# Patient Record
Sex: Female | Born: 1985
Health system: Southern US, Community
[De-identification: ages and names within clinical notes are randomized; demographics above are authoritative.]

## PROBLEM LIST (undated history)

## (undated) DIAGNOSIS — N7011 Chronic salpingitis: Secondary | ICD-10-CM

---

## 2007-03-20 ENCOUNTER — Emergency Department (HOSPITAL_COMMUNITY): Admission: EM | Admit: 2007-03-20 | Discharge: 2007-03-20 | Payer: Self-pay | Admitting: Family Medicine

## 2012-08-02 ENCOUNTER — Other Ambulatory Visit: Payer: Self-pay | Admitting: Obstetrics and Gynecology

## 2012-09-08 HISTORY — PX: MULTIPLE TOOTH EXTRACTIONS: SHX2053

## 2012-09-08 HISTORY — PX: WISDOM TOOTH EXTRACTION: SHX21

## 2012-11-22 ENCOUNTER — Encounter: Payer: Self-pay | Admitting: Obstetrics and Gynecology

## 2012-11-22 ENCOUNTER — Ambulatory Visit: Payer: BC Managed Care – PPO | Admitting: Obstetrics and Gynecology

## 2012-11-22 VITALS — BP 102/60 | Wt 117.0 lb

## 2012-11-22 DIAGNOSIS — N760 Acute vaginitis: Secondary | ICD-10-CM

## 2012-11-22 DIAGNOSIS — N898 Other specified noninflammatory disorders of vagina: Secondary | ICD-10-CM

## 2012-11-22 LAB — POCT WET PREP (WET MOUNT)

## 2012-11-22 MED ORDER — TINIDAZOLE 500 MG PO TABS: 2.0000 g | ORAL_TABLET | Freq: Every day | ORAL | Status: DC

## 2012-11-22 NOTE — Progress Notes (Signed)
Vaginal Discharge: Color: White Odor: yes Itching:no Thin:no Thick:no Fever:no Dyspareunia:no Hx PID:no HX STD:no Pelvic Pain:no Desires Gc/CT:no Desires HIV,RPR,HbsAG:no  C/o vaginal odor and d/c  Filed Vitals:   11/22/12 1425  BP: 102/60   ROS: noncontributory  Pelvic exam:  VULVA: normal appearing vulva with no masses, tenderness or lesions,  VAGINA: normal appearing vagina with normal color and discharge, no lesions, CERVIX: normal appearing cervix without discharge or lesions,  UTERUS: uterus is normal size, shape, consistency and nontender,  ADNEXA: normal adnexa in size, nontender and no masses.  A/P Wet prep - BV - Tindamax AEX in Jan 2015

## 2016-09-03 DIAGNOSIS — N760 Acute vaginitis: Secondary | ICD-10-CM | POA: Diagnosis not present

## 2016-11-11 DIAGNOSIS — N939 Abnormal uterine and vaginal bleeding, unspecified: Secondary | ICD-10-CM | POA: Diagnosis not present

## 2017-02-11 DIAGNOSIS — N970 Female infertility associated with anovulation: Secondary | ICD-10-CM | POA: Diagnosis not present

## 2017-02-19 DIAGNOSIS — N7001 Acute salpingitis: Secondary | ICD-10-CM | POA: Diagnosis not present

## 2017-03-10 ENCOUNTER — Other Ambulatory Visit (HOSPITAL_COMMUNITY): Payer: Self-pay | Admitting: Obstetrics and Gynecology

## 2017-03-10 DIAGNOSIS — Z3141 Encounter for fertility testing: Secondary | ICD-10-CM

## 2017-03-16 ENCOUNTER — Ambulatory Visit (HOSPITAL_COMMUNITY)
Admission: RE | Admit: 2017-03-16 | Discharge: 2017-03-16 | Disposition: A | Discharge status: Home / Self Care | Payer: BLUE CROSS/BLUE SHIELD | Source: Ambulatory Visit | Attending: Obstetrics and Gynecology | Admitting: Obstetrics and Gynecology

## 2017-03-16 DIAGNOSIS — Z3141 Encounter for fertility testing: Secondary | ICD-10-CM | POA: Insufficient documentation

## 2017-03-16 MED ORDER — IOPAMIDOL (ISOVUE-300) INJECTION 61%
30.0000 mL | Freq: Once | INTRAVENOUS | Status: AC | PRN
  Administered 2017-03-16: 10 mL via INTRAVENOUS

## 2017-05-06 DIAGNOSIS — N971 Female infertility of tubal origin: Secondary | ICD-10-CM | POA: Diagnosis not present

## 2017-05-06 DIAGNOSIS — Z319 Encounter for procreative management, unspecified: Secondary | ICD-10-CM | POA: Diagnosis not present

## 2017-05-06 DIAGNOSIS — Z31430 Encounter of female for testing for genetic disease carrier status for procreative management: Secondary | ICD-10-CM | POA: Diagnosis not present

## 2017-06-18 DIAGNOSIS — N979 Female infertility, unspecified: Secondary | ICD-10-CM | POA: Diagnosis not present

## 2017-06-18 DIAGNOSIS — N912 Amenorrhea, unspecified: Secondary | ICD-10-CM | POA: Diagnosis not present

## 2017-07-03 ENCOUNTER — Other Ambulatory Visit: Payer: Self-pay | Admitting: Obstetrics and Gynecology

## 2017-07-06 DIAGNOSIS — Z6823 Body mass index (BMI) 23.0-23.9, adult: Secondary | ICD-10-CM | POA: Diagnosis not present

## 2017-07-06 DIAGNOSIS — N970 Female infertility associated with anovulation: Secondary | ICD-10-CM | POA: Diagnosis not present

## 2017-07-15 ENCOUNTER — Encounter (HOSPITAL_BASED_OUTPATIENT_CLINIC_OR_DEPARTMENT_OTHER): Payer: Self-pay | Admitting: *Deleted

## 2017-07-15 ENCOUNTER — Other Ambulatory Visit: Payer: Self-pay

## 2017-07-15 DIAGNOSIS — N7011 Chronic salpingitis: Secondary | ICD-10-CM | POA: Diagnosis not present

## 2017-07-15 DIAGNOSIS — N971 Female infertility of tubal origin: Secondary | ICD-10-CM | POA: Diagnosis not present

## 2017-07-15 NOTE — Progress Notes (Signed)
Npo after midnight arrive 730 am 07-11-17 [facility] needs hemaglobin and urine pregnancy, husband driver. No meds to take. Needs pre op orders

## 2017-07-20 NOTE — H&P (Addendum)
Lynn Richards is a 31 y.o. female , originally referred to me by Dr. Belva AgeeElise Leger, for laparoscopy, lysis of adhesions, repair vs removal of hydrosalpinx of right, hysteroscopy, canulation of left tube.  She was diagnosed with hydrosalpinx of the right tube and adhesions to both tubes. Patient would like to preserve her childbearing potential.  Pertinent Gynecological History: Menses: flow is more than normal Bleeding: dysfunctional uterine bleeding Contraception: none DES exposure: denies Blood transfusions: none Sexually transmitted diseases: no past history   Last pap: normal  OB History: G0P0   Menstrual History: Menarche age: [age] No LMP recorded.    Past Medical History:  Diagnosis Date  . Hydrosalpinx    TUBAL ADHESIONS                    Past Surgical History:  Procedure Laterality Date  . MULTIPLE TOOTH EXTRACTIONS  2014  . WISDOM TOOTH EXTRACTION  2014             History reviewed. No pertinent family history. No hereditary disease.  No cancer of breast, ovary, uterus. No cutaneous leiomyomatosis or renal cell carcinoma.  Social History   Socioeconomic History  . Marital status: Married    Spouse name: Not on file  . Number of children: Not on file  . Years of education: Not on file  . Highest education level: Not on file  Social Needs  . Financial resource strain: Not on file  . Food insecurity - worry: Not on file  . Food insecurity - inability: Not on file  . Transportation needs - medical: Not on file  . Transportation needs - non-medical: Not on file  Occupational History  . Not on file  Tobacco Use  . Smoking status: Never Smoker  . Smokeless tobacco: Never Used  Substance and Sexual Activity  . Alcohol use: No    Frequency: Never  . Drug use: No  . Sexual activity: Yes    Partners: Male  Other Topics Concern  . Not on file  Social History Narrative  . Not on file    No Known Allergies  No current facility-administered medications on  file prior to encounter.    Current Outpatient Medications on File Prior to Encounter  Medication Sig Dispense Refill  . Echinacea 400 MG CAPS Take daily by mouth.    . Prenatal Vit-Fe Fumarate-FA (PRENATAL VITAMIN PO) Take daily by mouth.    . drospirenone-ethinyl estradiol (OCELLA) 3-0.03 MG tablet Take 1 tablet by mouth daily.    Marland Kitchen. tinidazole (TINDAMAX) 500 MG tablet Take 4 tablets (2,000 mg total) by mouth daily. For 2 days. 8 tablet 0     Review of Systems  Constitutional: Negative.   HENT: Negative.   Eyes: Negative.   Respiratory: Negative.   Cardiovascular: Negative.   Gastrointestinal: Negative.   Genitourinary: Negative.   Musculoskeletal: Negative.   Skin: Negative.   Neurological: Negative.   Endo/Heme/Allergies: Negative.   Psychiatric/Behavioral: Negative.      Physical Exam  Ht 5\' 5"  (1.651 m)   LMP 06/21/2017   BMI 19.47 kg/m  Constitutional: She is oriented to person, place, and time. She appears well-developed and well-nourished.  HENT:  Head: Normocephalic and atraumatic.  Nose: Nose normal.  Mouth/Throat: Oropharynx is clear and moist. No oropharyngeal exudate.  Eyes: Conjunctivae normal and EOM are normal. Pupils are equal, round, and reactive to light. No scleral icterus.  Neck: Normal range of motion. Neck supple. No tracheal deviation present. No thyromegaly present.  Cardiovascular: Normal rate.   Respiratory: Effort normal and breath sounds normal.  GI: Soft. Bowel sounds are normal. She exhibits no distension and no mass. There is no tenderness.  Lymphadenopathy:    She has no cervical adenopathy.  Neurological: She is alert and oriented to person, place, and time. She has normal reflexes.  Skin: Skin is warm.  Psychiatric: She has a normal mood and affect. Her behavior is normal. Judgment and thought content normal.     Assessment/Plan:  Hydrosalpinx and paratubal adhesions. Laparoscopy, lysis of adhesions, repair vs removal of  hydrosalpinix of right, hysteroscopy, canulation of left tube Benefits and risks of laparoscopy, lysis of adhesions, repair vs removal of hydrosalpinx of right, hysteroscopy, cannulation of left tube were discussed with the patient and her family members again.  Bowel prep instructions were given.  All of patient's questions were answered.  She verbalized understanding.

## 2017-07-21 ENCOUNTER — Encounter (HOSPITAL_BASED_OUTPATIENT_CLINIC_OR_DEPARTMENT_OTHER): Payer: Self-pay

## 2017-07-21 ENCOUNTER — Ambulatory Visit (HOSPITAL_BASED_OUTPATIENT_CLINIC_OR_DEPARTMENT_OTHER): Payer: BLUE CROSS/BLUE SHIELD | Admitting: Anesthesiology

## 2017-07-21 ENCOUNTER — Encounter (HOSPITAL_BASED_OUTPATIENT_CLINIC_OR_DEPARTMENT_OTHER)
Admission: RE | Disposition: A | Discharge status: Home / Self Care | Payer: Self-pay | Source: Ambulatory Visit | Attending: Obstetrics and Gynecology

## 2017-07-21 ENCOUNTER — Ambulatory Visit (HOSPITAL_BASED_OUTPATIENT_CLINIC_OR_DEPARTMENT_OTHER)
Admission: RE | Admit: 2017-07-21 | Discharge: 2017-07-21 | Disposition: A | Discharge status: Home / Self Care | Payer: BLUE CROSS/BLUE SHIELD | Source: Ambulatory Visit | Attending: Obstetrics and Gynecology | Admitting: Obstetrics and Gynecology

## 2017-07-21 DIAGNOSIS — N736 Female pelvic peritoneal adhesions (postinfective): Secondary | ICD-10-CM | POA: Diagnosis not present

## 2017-07-21 DIAGNOSIS — N971 Female infertility of tubal origin: Secondary | ICD-10-CM | POA: Insufficient documentation

## 2017-07-21 DIAGNOSIS — Q5122 Other partial doubling of uterus: Secondary | ICD-10-CM | POA: Diagnosis not present

## 2017-07-21 DIAGNOSIS — N7011 Chronic salpingitis: Secondary | ICD-10-CM | POA: Insufficient documentation

## 2017-07-21 DIAGNOSIS — Q512 Other doubling of uterus, unspecified: Secondary | ICD-10-CM | POA: Diagnosis not present

## 2017-07-21 HISTORY — PX: LAPAROSCOPIC UNILATERAL SALPINGECTOMY: SHX5934

## 2017-07-21 HISTORY — DX: Chronic salpingitis: N70.11

## 2017-07-21 LAB — POCT PREGNANCY, URINE: PREG TEST UR: NEGATIVE

## 2017-07-21 LAB — POCT HEMOGLOBIN-HEMACUE: Hemoglobin: 12 g/dL (ref 12.0–15.0)

## 2017-07-21 SURGERY — SALPINGECTOMY, UNILATERAL, LAPAROSCOPIC
Anesthesia: General | Site: Abdomen | Laterality: Right

## 2017-07-21 MED ORDER — ACETAMINOPHEN 10 MG/ML IV SOLN
INTRAVENOUS | Status: AC
  Filled 2017-07-21: qty 100

## 2017-07-21 MED ORDER — METHYLENE BLUE 0.5 % INJ SOLN
INTRAVENOUS | Status: DC | PRN
  Administered 2017-07-21 (×2): 2 mL via SUBMUCOSAL

## 2017-07-21 MED ORDER — CEFAZOLIN SODIUM-DEXTROSE 2-4 GM/100ML-% IV SOLN
2.0000 g | INTRAVENOUS | Status: AC
  Administered 2017-07-21: 2 g via INTRAVENOUS
  Filled 2017-07-21: qty 100

## 2017-07-21 MED ORDER — FENTANYL CITRATE (PF) 250 MCG/5ML IJ SOLN
INTRAMUSCULAR | Status: AC
  Filled 2017-07-21: qty 5

## 2017-07-21 MED ORDER — ROCURONIUM BROMIDE 10 MG/ML (PF) SYRINGE
PREFILLED_SYRINGE | INTRAVENOUS | Status: DC | PRN
  Administered 2017-07-21: 50 mg via INTRAVENOUS
  Administered 2017-07-21 (×3): 10 mg via INTRAVENOUS

## 2017-07-21 MED ORDER — OXYCODONE-ACETAMINOPHEN 7.5-325 MG PO TABS: 1.0000 | ORAL_TABLET | ORAL | 0 refills | Status: DC | PRN

## 2017-07-21 MED ORDER — DEXAMETHASONE SODIUM PHOSPHATE 10 MG/ML IJ SOLN
INTRAMUSCULAR | Status: DC | PRN
  Administered 2017-07-21: 10 mg via INTRAVENOUS

## 2017-07-21 MED ORDER — MIDAZOLAM HCL 2 MG/2ML IJ SOLN
INTRAMUSCULAR | Status: AC
  Filled 2017-07-21: qty 2

## 2017-07-21 MED ORDER — ONDANSETRON HCL 4 MG PO TABS: 4.0000 mg | ORAL_TABLET | Freq: Three times a day (TID) | ORAL | 0 refills | Status: DC | PRN

## 2017-07-21 MED ORDER — BUPIVACAINE-EPINEPHRINE 0.25% -1:200000 IJ SOLN
INTRAMUSCULAR | Status: DC | PRN
  Administered 2017-07-21: 8 mL

## 2017-07-21 MED ORDER — ONDANSETRON HCL 4 MG/2ML IJ SOLN
INTRAMUSCULAR | Status: AC
  Filled 2017-07-21: qty 2

## 2017-07-21 MED ORDER — LACTATED RINGERS IV SOLN
INTRAVENOUS | Status: DC
  Administered 2017-07-21 (×4): via INTRAVENOUS
  Filled 2017-07-21: qty 1000

## 2017-07-21 MED ORDER — METHYLENE BLUE 0.5 % INJ SOLN
INTRAVENOUS | Status: AC
  Filled 2017-07-21: qty 10

## 2017-07-21 MED ORDER — CEFAZOLIN SODIUM-DEXTROSE 2-4 GM/100ML-% IV SOLN
INTRAVENOUS | Status: AC
  Filled 2017-07-21: qty 100

## 2017-07-21 MED ORDER — ONDANSETRON HCL 4 MG/2ML IJ SOLN
INTRAMUSCULAR | Status: DC | PRN
  Administered 2017-07-21: 4 mg via INTRAVENOUS

## 2017-07-21 MED ORDER — HYDROMORPHONE HCL 1 MG/ML IJ SOLN
INTRAMUSCULAR | Status: AC
  Filled 2017-07-21: qty 1

## 2017-07-21 MED ORDER — PROPOFOL 10 MG/ML IV BOLUS
INTRAVENOUS | Status: AC
  Filled 2017-07-21: qty 40

## 2017-07-21 MED ORDER — LIDOCAINE 2% (20 MG/ML) 5 ML SYRINGE
INTRAMUSCULAR | Status: AC
  Filled 2017-07-21: qty 5

## 2017-07-21 MED ORDER — PROPOFOL 10 MG/ML IV BOLUS
INTRAVENOUS | Status: DC | PRN
  Administered 2017-07-21: 130 mg via INTRAVENOUS

## 2017-07-21 MED ORDER — ONDANSETRON HCL 4 MG/2ML IJ SOLN
4.0000 mg | Freq: Once | INTRAMUSCULAR | Status: AC | PRN
  Administered 2017-07-21: 4 mg via INTRAVENOUS
  Filled 2017-07-21: qty 2

## 2017-07-21 MED ORDER — OXYCODONE HCL 5 MG PO TABS
5.0000 mg | ORAL_TABLET | Freq: Once | ORAL | Status: AC
Start: 2017-07-21 — End: 2017-07-21
  Administered 2017-07-21: 5 mg via ORAL
  Filled 2017-07-21: qty 1

## 2017-07-21 MED ORDER — FENTANYL CITRATE (PF) 100 MCG/2ML IJ SOLN
INTRAMUSCULAR | Status: DC | PRN
  Administered 2017-07-21 (×2): 50 ug via INTRAVENOUS
  Administered 2017-07-21: 150 ug via INTRAVENOUS

## 2017-07-21 MED ORDER — ROCURONIUM BROMIDE 50 MG/5ML IV SOSY
PREFILLED_SYRINGE | INTRAVENOUS | Status: AC
  Filled 2017-07-21: qty 5

## 2017-07-21 MED ORDER — SUGAMMADEX SODIUM 200 MG/2ML IV SOLN
INTRAVENOUS | Status: AC
  Filled 2017-07-21: qty 2

## 2017-07-21 MED ORDER — MEPERIDINE HCL 25 MG/ML IJ SOLN
6.2500 mg | INTRAMUSCULAR | Status: DC | PRN
  Filled 2017-07-21: qty 1

## 2017-07-21 MED ORDER — DEXAMETHASONE SODIUM PHOSPHATE 10 MG/ML IJ SOLN
INTRAMUSCULAR | Status: AC
  Filled 2017-07-21: qty 1

## 2017-07-21 MED ORDER — MIDAZOLAM HCL 2 MG/2ML IJ SOLN
INTRAMUSCULAR | Status: DC | PRN
  Administered 2017-07-21: 2 mg via INTRAVENOUS

## 2017-07-21 MED ORDER — SUGAMMADEX SODIUM 200 MG/2ML IV SOLN
INTRAVENOUS | Status: DC | PRN
  Administered 2017-07-21: 300 mg via INTRAVENOUS

## 2017-07-21 MED ORDER — LIDOCAINE 2% (20 MG/ML) 5 ML SYRINGE
INTRAMUSCULAR | Status: DC | PRN
  Administered 2017-07-21: 100 mg via INTRAVENOUS

## 2017-07-21 MED ORDER — SUGAMMADEX SODIUM 200 MG/2ML IV SOLN
INTRAVENOUS | Status: AC
  Filled 2017-07-21: qty 4

## 2017-07-21 MED ORDER — HYDROMORPHONE HCL 1 MG/ML IJ SOLN
0.2500 mg | INTRAMUSCULAR | Status: DC | PRN
  Administered 2017-07-21 (×2): 0.5 mg via INTRAVENOUS
  Filled 2017-07-21: qty 0.5

## 2017-07-21 MED ORDER — OXYCODONE HCL 5 MG PO TABS
ORAL_TABLET | ORAL | Status: AC
  Filled 2017-07-21: qty 1

## 2017-07-21 SURGICAL SUPPLY — 42 items
BAG URINE DRAINAGE (UROLOGICAL SUPPLIES) ×2 IMPLANT
BARRIER ADHS 3X4 INTERCEED (GAUZE/BANDAGES/DRESSINGS) ×2 IMPLANT
CABLE HIGH FREQUENCY MONO STRZ (ELECTRODE) ×2 IMPLANT
CATH FOLEY 2WAY SLVR  5CC 14FR (CATHETERS) ×1
CATH FOLEY 2WAY SLVR 5CC 14FR (CATHETERS) ×1 IMPLANT
CATH SSG INJECTION W/GUIDEWIRE (BALLOONS) ×2 IMPLANT
CATH UROLOGY TORQUE 40 (MISCELLANEOUS) ×2 IMPLANT
CLOTH BEACON ORANGE TIMEOUT ST (SAFETY) ×2 IMPLANT
CONT SPECI 4OZ STER CLIK (MISCELLANEOUS) IMPLANT
COVER MAYO STAND STRL (DRAPES) ×2 IMPLANT
DERMABOND ADVANCED (GAUZE/BANDAGES/DRESSINGS) ×1
DERMABOND ADVANCED .7 DNX12 (GAUZE/BANDAGES/DRESSINGS) ×1 IMPLANT
DRSG COVADERM PLUS 2X2 (GAUZE/BANDAGES/DRESSINGS) IMPLANT
DRSG OPSITE POSTOP 3X4 (GAUZE/BANDAGES/DRESSINGS) IMPLANT
DURAPREP 26ML APPLICATOR (WOUND CARE) ×2 IMPLANT
ELECT REM PT RETURN 9FT ADLT (ELECTROSURGICAL) ×2
ELECTRODE REM PT RTRN 9FT ADLT (ELECTROSURGICAL) ×1 IMPLANT
GLOVE BIO SURGEON STRL SZ8 (GLOVE) ×4 IMPLANT
GOWN STRL REUS W/TWL LRG LVL3 (GOWN DISPOSABLE) ×2 IMPLANT
MANIPULATOR UTERINE 4.5 ZUMI (MISCELLANEOUS) ×2 IMPLANT
NEEDLE INSUFFLATION 120MM (ENDOMECHANICALS) ×2 IMPLANT
NEEDLE SPNL 22GX7 QUINCKE BK (NEEDLE) ×2 IMPLANT
PACK LAPAROSCOPY BASIN (CUSTOM PROCEDURE TRAY) ×2 IMPLANT
PACK TRENDGUARD 450 HYBRID PRO (MISCELLANEOUS) ×1 IMPLANT
PACK VAGINAL MINOR WOMEN LF (CUSTOM PROCEDURE TRAY) ×2 IMPLANT
POUCH SPECIMEN RETRIEVAL 10MM (ENDOMECHANICALS) IMPLANT
SEPRAFILM MEMBRANE 5X6 (MISCELLANEOUS) IMPLANT
SET IRRIG TUBING LAPAROSCOPIC (IRRIGATION / IRRIGATOR) ×2 IMPLANT
SET IRRIG Y TYPE TUR BLADDER L (SET/KITS/TRAYS/PACK) ×2 IMPLANT
SUT MNCRL AB 4-0 PS2 18 (SUTURE) ×2 IMPLANT
SUT PROLENE 5 0 P 3 (SUTURE) ×4 IMPLANT
SYR 30ML LL (SYRINGE) ×2 IMPLANT
SYR 50ML LL SCALE MARK (SYRINGE) IMPLANT
SYR 5ML LL (SYRINGE) ×2 IMPLANT
SYR CONTROL 10ML LL (SYRINGE) ×2 IMPLANT
TOWEL OR 17X24 6PK STRL BLUE (TOWEL DISPOSABLE) ×4 IMPLANT
TRAY FOLEY CATH SILVER 14FR (SET/KITS/TRAYS/PACK) IMPLANT
TRENDGUARD 450 HYBRID PRO PACK (MISCELLANEOUS) ×2
TROCAR OPTI TIP 5M 100M (ENDOMECHANICALS) ×4 IMPLANT
TUBE CONNECTING 12X1/4 (SUCTIONS) ×2 IMPLANT
TUBING INSUF HEATED (TUBING) ×2 IMPLANT
WARMER LAPAROSCOPE (MISCELLANEOUS) ×2 IMPLANT

## 2017-07-21 NOTE — Anesthesia Procedure Notes (Signed)
Procedure Name: Intubation Date/Time: 07/21/2017 10:19 AM Performed by: Wanita Chamberlain, CRNA Pre-anesthesia Checklist: Timeout performed, Patient identified, Emergency Drugs available, Suction available and Patient being monitored Patient Re-evaluated:Patient Re-evaluated prior to induction Oxygen Delivery Method: Circle system utilized Preoxygenation: Pre-oxygenation with 100% oxygen Induction Type: IV induction Ventilation: Mask ventilation without difficulty Laryngoscope Size: Mac and 3 Grade View: Grade I Tube type: Oral Tube size: 7.0 mm Number of attempts: 1 Placement Confirmation: ETT inserted through vocal cords under direct vision,  positive ETCO2 and breath sounds checked- equal and bilateral Secured at: 21 cm Dental Injury: Teeth and Oropharynx as per pre-operative assessment

## 2017-07-21 NOTE — Anesthesia Postprocedure Evaluation (Signed)
Anesthesia Post Note  Patient: Radiation protection practitionerAsia Richards  Procedure(s) Performed:         Hysteroscopic incision of uterine septum, Left tubal catheterization, endometrial biopsy, laparoscopic lysis of adhesions and left salpingoeostomy   (Right Abdomen)     Patient location during evaluation: PACU Anesthesia Type: General Level of consciousness: awake and alert, patient cooperative and oriented Pain management: pain level controlled Vital Signs Assessment: post-procedure vital signs reviewed and stable Respiratory status: spontaneous breathing, nonlabored ventilation and respiratory function stable Cardiovascular status: blood pressure returned to baseline and stable Postop Assessment: no apparent nausea or vomiting Anesthetic complications: no    Last Vitals:  Vitals:   07/21/17 1445 07/21/17 1500  BP: 119/79 127/81  Pulse: 74 85  Resp: 15 14  Temp:    SpO2: 95% 98%    Last Pain:  Vitals:   07/21/17 1530  TempSrc:   PainSc: 4                  Lem Peary,E. Cambell Stanek

## 2017-07-21 NOTE — Op Note (Signed)
Operative Note  Preoperative diagnosis: Right hydrosalpinx, left proximal tubal occlusion, suspected pelvic adhesions  Postoperative diagnosis: Bilateral hydrosalpinx, left proximal tubal occlusion (left bipolar tubal disease, partial uterine septum   Procedure: Laparoscopy, lysis of adhesions,, bilateral salpingo-neostomy, hysteroscopy, incisional uterine septum, endometrial biopsy, hysteroscopic left tubal recanalization  Anesthesia: Gen. endotracheal  Complications: None  Estimated blood loss: <10 cc  Specimens:  Left ovarian adhesion to pathology  Findings: On laparoscopy, upper abdomen, liver surface and diaphragm surfaces were normal except for a single adhesion between the left liver surface and the diaphragm. Gallbladder was  normal. The appendix was  grossly normal The pelvic peritoneum had inflammatory loose adhesions throughout.   her left tube was 20% encased in filmy adhesions to the left ovary. Upon dissection of these adhesions were able to see a fimbrial tuft. The left tube was initially proximally occluded. After hysteroscopic tubal recanalization tube filled with a hydrosalpinx.  During the tuboplasty the left tubal mucosal folds were 50% preserved. The hydrosalpinx was thin-walled.  The left ovary was 20% encased in filmy adhesions to the left tube and to the pelvic sidewall. These were lysed.   Similar to the left side, the right tube was 20% encased in filmy adhesions to the right ovary. It showed hydrosalpinx of about 1.5 cm diameter. The hydrosalpinx was thin-walled and upon tuboplasty, the mucosal folds in the tubes were 75% preserved.   Right ovary was encased in filmy adhesions this 20% over its surface area to the pelvic sidewall and to the right tube. These adhesions were lysed.   On hysteroscopy, endocervical canal appeared normal and the uterus sounded to 7 cm and had menstrual pattern endometrium. There was a midline fundal indentation of about 1 cm into the  cavity, giving the uterine cavity. A partially septate appearance. This septum was corrected during the hysteroscopy. Both tubal ostia were seen and appeared unobstructed.  Description of the procedure: The patient was placed in dorsal supine position and general endotracheal anesthesia was given. 2 g of cefazolin were given intravenously for prophylaxis. Patient was placed in lithotomy position. She was prepped and draped inside manner.  . A Foley catheter was inserted into the bladder.  After preemptive anesthesia of all surgical sites with 0.25% bupivacaine with 1 200,000 epinephrine, a 5 mm intraumbilical skin incision was made and a Verress needle was inserted. Its correct location was confirmed. A pneumoperitoneum was created with carbon dioxide.  5 mm laparoscope with a 30 lens was inserted and video laparoscopy was started . A left lower quadrant 5 mm and a right lower quadrant  5 mm incisions were made and ancillary trochars were placed under direct visualization. Above findings were noted.   Using a needle electrode on 35 W of cutting current, we lysed the adhesions surrounding the left ovary and the left tube and separated the clubbed distal end of the left tube from its adhesion to the surface of the left ovary.  We used the same mode of energy to repeat the same procedures on the right tube and ovary and performed a thorough salpingo-oophorolysis.  The surgeon then proceeded for hysteroscopy and dilute vasopressin was injected into the cervix. Video hysteroscopy was started with a Slimline 12 hysteroscope. Distention medium was saline and distention method was gravity. Above findings were noted.  Using hysteroscopic scissors. We first incised the midline septum until straight uterine fundus was obtained. Good hemostasis was ensured. We then passed a 5 French torque catheter and wedged it into the left  tubal ostium, since previous chromotubation through the ZUMI catheter had shown  nonfilling of the left tube with formation of a right hydrosalpinx. The cornual axis catheter with a 0.015 inch glide wire was then passed through the torque catheter and the tubal catheter could be advanced into the proximal tube by about 1 cm. The guidewire was pulled out and selective chromotubation was performed for the left tube showing filling of the left tube and formation of a left hydrosalpinx due to distal obstruction.  The patient's preoperative wish was to preserve fallopian tubes if at all possible, even though bilateral salpingectomy and subsequent IVF was recommended as a better choice. Considering that her hydrosalpinges were thin-walled, decision was made to proceed with bilateral salpingoneostomies We first injected dilute vasopressin into the mesosalpinx of the left hydrosalpinx. A stellate incision was made right at the fimbrial tuft using needle electrode with cutting current at 20 W and then sharp scissors were used to enter the tubal lumen. The flaps were everted and sewn onto the ampullary serosa at corresponding spots using 5-0 Prolene and intracorporeal knot tying.  The same steps were followed for tuboplasty on the right hydrosalpinx. Again, the distal tubal flaps were everted and sewn onto the ampullary serosa using 5-0 Prolene and intracorporeal knot tying.    Adequate hemostasis was obtained. The pelvis was copiously irrigated and aspirated. As an adhesion barrier, we divided a sheet of Interceed into 2 and delivered it into the abdomen and wrapped distal end of each tuboplasty in the Interceed sheets.  Instrument and lap pad count were correct. The trochars were removed and the gas was allowed to escape. The incisions were approximated with Dermabond.  The patient tolerated the procedure well and was transferred to recovery room in satisfactory condition.  Fermin SchwabYALCINKAYA,Emersyn Wyss, MD

## 2017-07-21 NOTE — Transfer of Care (Signed)
Immediate Anesthesia Transfer of Care Note  Patient: Lynn Richards  Procedure(s) Performed:         Hysteroscopic incision of uterine septum, Left tubal catheterization, endometrial biopsy, laparoscopic lysis of adhesions and left salpingoeostomy   (Right Abdomen)  Patient Location: PACU  Anesthesia Type:General  Level of Consciousness: awake, alert , oriented and patient cooperative  Airway & Oxygen Therapy: Patient Spontanous Breathing and Patient connected to nasal cannula oxygen  Post-op Assessment: Report given to RN and Post -op Vital signs reviewed and stable  Post vital signs: Reviewed and stable  Last Vitals:  Vitals:   07/21/17 0732  BP: 123/64  Pulse: 76  Resp: 14  Temp: 37.1 C  SpO2: 100%    Last Pain:  Vitals:   07/21/17 0732  TempSrc: Oral      Patients Stated Pain Goal: 5 (07/21/17 0748)  Complications: No apparent anesthesia complications

## 2017-07-21 NOTE — Discharge Instructions (Signed)

## 2017-07-21 NOTE — Anesthesia Preprocedure Evaluation (Addendum)
Anesthesia Evaluation  Patient identified by MRN, date of birth, ID band Patient awake    Reviewed: Allergy & Precautions, NPO status , Patient's Chart, lab work & pertinent test results  Airway Mallampati: I  TM Distance: >3 FB Neck ROM: Full    Dental  (+) Teeth Intact, Dental Advisory Given   Pulmonary    Pulmonary exam normal        Cardiovascular Exercise Tolerance: Good Normal cardiovascular exam Rhythm:Regular Rate:Normal     Neuro/Psych negative neurological ROS  negative psych ROS   GI/Hepatic Neg liver ROS,   Endo/Other  negative endocrine ROS  Renal/GU negative Renal ROS     Musculoskeletal negative musculoskeletal ROS (+)   Abdominal   Peds  Hematology negative hematology ROS (+)   Anesthesia Other Findings   Reproductive/Obstetrics                           Anesthesia Physical Anesthesia Plan  ASA: II  Anesthesia Plan: General   Post-op Pain Management:    Induction: Intravenous  PONV Risk Score and Plan: 3 and Ondansetron, Dexamethasone and Treatment may vary due to age or medical condition  Airway Management Planned: Oral ETT  Additional Equipment:   Intra-op Plan:   Post-operative Plan: Extubation in OR  Informed Consent: I have reviewed the patients History and Physical, chart, labs and discussed the procedure including the risks, benefits and alternatives for the proposed anesthesia with the patient or authorized representative who has indicated his/her understanding and acceptance.     Plan Discussed with: CRNA and Surgeon  Anesthesia Plan Comments:         Anesthesia Quick Evaluation

## 2017-07-22 ENCOUNTER — Encounter (HOSPITAL_BASED_OUTPATIENT_CLINIC_OR_DEPARTMENT_OTHER): Payer: Self-pay | Admitting: Obstetrics and Gynecology

## 2017-10-14 DIAGNOSIS — N8500 Endometrial hyperplasia, unspecified: Secondary | ICD-10-CM | POA: Diagnosis not present

## 2017-10-14 DIAGNOSIS — Z3141 Encounter for fertility testing: Secondary | ICD-10-CM | POA: Diagnosis not present

## 2017-10-14 DIAGNOSIS — Z113 Encounter for screening for infections with a predominantly sexual mode of transmission: Secondary | ICD-10-CM | POA: Diagnosis not present

## 2017-11-02 DIAGNOSIS — N970 Female infertility associated with anovulation: Secondary | ICD-10-CM | POA: Diagnosis not present

## 2017-11-02 DIAGNOSIS — Z3183 Encounter for assisted reproductive fertility procedure cycle: Secondary | ICD-10-CM | POA: Diagnosis not present

## 2017-11-02 DIAGNOSIS — N7011 Chronic salpingitis: Secondary | ICD-10-CM | POA: Diagnosis not present

## 2017-11-02 DIAGNOSIS — N971 Female infertility of tubal origin: Secondary | ICD-10-CM | POA: Diagnosis not present

## 2017-11-02 DIAGNOSIS — Z113 Encounter for screening for infections with a predominantly sexual mode of transmission: Secondary | ICD-10-CM | POA: Diagnosis not present

## 2017-11-05 DIAGNOSIS — Z319 Encounter for procreative management, unspecified: Secondary | ICD-10-CM | POA: Diagnosis not present

## 2017-11-05 DIAGNOSIS — E288 Other ovarian dysfunction: Secondary | ICD-10-CM | POA: Diagnosis not present

## 2017-11-05 DIAGNOSIS — Z113 Encounter for screening for infections with a predominantly sexual mode of transmission: Secondary | ICD-10-CM | POA: Diagnosis not present

## 2017-11-05 DIAGNOSIS — Z3183 Encounter for assisted reproductive fertility procedure cycle: Secondary | ICD-10-CM | POA: Diagnosis not present

## 2017-11-05 DIAGNOSIS — Z31430 Encounter of female for testing for genetic disease carrier status for procreative management: Secondary | ICD-10-CM | POA: Diagnosis not present

## 2017-11-07 DIAGNOSIS — N970 Female infertility associated with anovulation: Secondary | ICD-10-CM | POA: Diagnosis not present

## 2017-11-07 DIAGNOSIS — Z3183 Encounter for assisted reproductive fertility procedure cycle: Secondary | ICD-10-CM | POA: Diagnosis not present

## 2017-11-07 DIAGNOSIS — N7011 Chronic salpingitis: Secondary | ICD-10-CM | POA: Diagnosis not present

## 2017-11-07 DIAGNOSIS — N971 Female infertility of tubal origin: Secondary | ICD-10-CM | POA: Diagnosis not present

## 2017-11-10 DIAGNOSIS — N971 Female infertility of tubal origin: Secondary | ICD-10-CM | POA: Diagnosis not present

## 2017-11-10 DIAGNOSIS — N7011 Chronic salpingitis: Secondary | ICD-10-CM | POA: Diagnosis not present

## 2017-11-10 DIAGNOSIS — Z3183 Encounter for assisted reproductive fertility procedure cycle: Secondary | ICD-10-CM | POA: Diagnosis not present

## 2017-11-10 DIAGNOSIS — N970 Female infertility associated with anovulation: Secondary | ICD-10-CM | POA: Diagnosis not present

## 2017-11-11 DIAGNOSIS — Z3183 Encounter for assisted reproductive fertility procedure cycle: Secondary | ICD-10-CM | POA: Diagnosis not present

## 2017-11-11 DIAGNOSIS — N971 Female infertility of tubal origin: Secondary | ICD-10-CM | POA: Diagnosis not present

## 2017-11-11 DIAGNOSIS — N970 Female infertility associated with anovulation: Secondary | ICD-10-CM | POA: Diagnosis not present

## 2017-11-11 DIAGNOSIS — N7011 Chronic salpingitis: Secondary | ICD-10-CM | POA: Diagnosis not present

## 2017-11-13 DIAGNOSIS — Z3183 Encounter for assisted reproductive fertility procedure cycle: Secondary | ICD-10-CM | POA: Diagnosis not present

## 2017-11-13 DIAGNOSIS — Z3141 Encounter for fertility testing: Secondary | ICD-10-CM | POA: Diagnosis not present

## 2017-11-18 DIAGNOSIS — Z3183 Encounter for assisted reproductive fertility procedure cycle: Secondary | ICD-10-CM | POA: Diagnosis not present

## 2017-11-19 DIAGNOSIS — Z3183 Encounter for assisted reproductive fertility procedure cycle: Secondary | ICD-10-CM | POA: Diagnosis not present

## 2017-12-03 DIAGNOSIS — N7011 Chronic salpingitis: Secondary | ICD-10-CM | POA: Diagnosis not present

## 2017-12-03 DIAGNOSIS — N971 Female infertility of tubal origin: Secondary | ICD-10-CM | POA: Diagnosis not present

## 2017-12-03 DIAGNOSIS — N970 Female infertility associated with anovulation: Secondary | ICD-10-CM | POA: Diagnosis not present

## 2017-12-03 DIAGNOSIS — Z3183 Encounter for assisted reproductive fertility procedure cycle: Secondary | ICD-10-CM | POA: Diagnosis not present

## 2017-12-07 DIAGNOSIS — Z3183 Encounter for assisted reproductive fertility procedure cycle: Secondary | ICD-10-CM | POA: Diagnosis not present

## 2017-12-23 DIAGNOSIS — N970 Female infertility associated with anovulation: Secondary | ICD-10-CM | POA: Diagnosis not present

## 2017-12-23 DIAGNOSIS — N7011 Chronic salpingitis: Secondary | ICD-10-CM | POA: Diagnosis not present

## 2017-12-23 DIAGNOSIS — Z3183 Encounter for assisted reproductive fertility procedure cycle: Secondary | ICD-10-CM | POA: Diagnosis not present

## 2017-12-23 DIAGNOSIS — Z113 Encounter for screening for infections with a predominantly sexual mode of transmission: Secondary | ICD-10-CM | POA: Diagnosis not present

## 2017-12-29 DIAGNOSIS — Z3183 Encounter for assisted reproductive fertility procedure cycle: Secondary | ICD-10-CM | POA: Diagnosis not present

## 2018-01-06 DIAGNOSIS — Z3201 Encounter for pregnancy test, result positive: Secondary | ICD-10-CM | POA: Diagnosis not present

## 2018-01-06 DIAGNOSIS — Z3200 Encounter for pregnancy test, result unknown: Secondary | ICD-10-CM | POA: Diagnosis not present

## 2018-01-08 DIAGNOSIS — Z3200 Encounter for pregnancy test, result unknown: Secondary | ICD-10-CM | POA: Diagnosis not present

## 2018-01-08 DIAGNOSIS — Z3201 Encounter for pregnancy test, result positive: Secondary | ICD-10-CM | POA: Diagnosis not present

## 2018-01-21 DIAGNOSIS — Z3200 Encounter for pregnancy test, result unknown: Secondary | ICD-10-CM | POA: Diagnosis not present

## 2018-02-03 DIAGNOSIS — O0900 Supervision of pregnancy with history of infertility, unspecified trimester: Secondary | ICD-10-CM | POA: Diagnosis not present

## 2018-02-17 LAB — OB RESULTS CONSOLE GC/CHLAMYDIA
Chlamydia: NEGATIVE
Gonorrhea: NEGATIVE

## 2018-02-17 LAB — OB RESULTS CONSOLE HIV ANTIBODY (ROUTINE TESTING): HIV: NONREACTIVE

## 2018-02-17 LAB — OB RESULTS CONSOLE RUBELLA ANTIBODY, IGM: Rubella: IMMUNE

## 2018-02-17 LAB — OB RESULTS CONSOLE ABO/RH: RH Type: POSITIVE

## 2018-02-17 LAB — OB RESULTS CONSOLE RPR: RPR: NONREACTIVE

## 2018-02-17 LAB — OB RESULTS CONSOLE ANTIBODY SCREEN: ANTIBODY SCREEN: NEGATIVE

## 2018-02-17 LAB — OB RESULTS CONSOLE HEPATITIS B SURFACE ANTIGEN: Hepatitis B Surface Ag: NEGATIVE

## 2018-02-26 DIAGNOSIS — Z36 Encounter for antenatal screening for chromosomal anomalies: Secondary | ICD-10-CM | POA: Diagnosis not present

## 2018-02-26 DIAGNOSIS — Z3A11 11 weeks gestation of pregnancy: Secondary | ICD-10-CM | POA: Diagnosis not present

## 2018-02-26 DIAGNOSIS — Z124 Encounter for screening for malignant neoplasm of cervix: Secondary | ICD-10-CM | POA: Diagnosis not present

## 2018-02-26 DIAGNOSIS — Z113 Encounter for screening for infections with a predominantly sexual mode of transmission: Secondary | ICD-10-CM | POA: Diagnosis not present

## 2018-02-26 DIAGNOSIS — Z3491 Encounter for supervision of normal pregnancy, unspecified, first trimester: Secondary | ICD-10-CM | POA: Diagnosis not present

## 2018-02-26 DIAGNOSIS — O0901 Supervision of pregnancy with history of infertility, first trimester: Secondary | ICD-10-CM | POA: Diagnosis not present

## 2018-02-26 DIAGNOSIS — Z3689 Encounter for other specified antenatal screening: Secondary | ICD-10-CM | POA: Diagnosis not present

## 2018-02-26 DIAGNOSIS — Z118 Encounter for screening for other infectious and parasitic diseases: Secondary | ICD-10-CM | POA: Diagnosis not present

## 2018-02-26 DIAGNOSIS — Z01419 Encounter for gynecological examination (general) (routine) without abnormal findings: Secondary | ICD-10-CM | POA: Diagnosis not present

## 2018-03-09 DIAGNOSIS — Z3682 Encounter for antenatal screening for nuchal translucency: Secondary | ICD-10-CM | POA: Diagnosis not present

## 2018-03-09 DIAGNOSIS — Z3A12 12 weeks gestation of pregnancy: Secondary | ICD-10-CM | POA: Diagnosis not present

## 2018-03-09 DIAGNOSIS — O0901 Supervision of pregnancy with history of infertility, first trimester: Secondary | ICD-10-CM | POA: Diagnosis not present

## 2018-04-01 DIAGNOSIS — Z3A16 16 weeks gestation of pregnancy: Secondary | ICD-10-CM | POA: Diagnosis not present

## 2018-04-01 DIAGNOSIS — Z361 Encounter for antenatal screening for raised alphafetoprotein level: Secondary | ICD-10-CM | POA: Diagnosis not present

## 2018-04-01 DIAGNOSIS — O4442 Low lying placenta NOS or without hemorrhage, second trimester: Secondary | ICD-10-CM | POA: Diagnosis not present

## 2018-04-23 DIAGNOSIS — O4442 Low lying placenta NOS or without hemorrhage, second trimester: Secondary | ICD-10-CM | POA: Diagnosis not present

## 2018-04-23 DIAGNOSIS — Z363 Encounter for antenatal screening for malformations: Secondary | ICD-10-CM | POA: Diagnosis not present

## 2018-04-23 DIAGNOSIS — Z3A19 19 weeks gestation of pregnancy: Secondary | ICD-10-CM | POA: Diagnosis not present

## 2018-05-20 DIAGNOSIS — Z3A23 23 weeks gestation of pregnancy: Secondary | ICD-10-CM | POA: Diagnosis not present

## 2018-05-20 DIAGNOSIS — O4442 Low lying placenta NOS or without hemorrhage, second trimester: Secondary | ICD-10-CM | POA: Diagnosis not present

## 2018-05-28 DIAGNOSIS — O358XX0 Maternal care for other (suspected) fetal abnormality and damage, not applicable or unspecified: Secondary | ICD-10-CM | POA: Diagnosis not present

## 2018-05-28 DIAGNOSIS — O0902 Supervision of pregnancy with history of infertility, second trimester: Secondary | ICD-10-CM | POA: Diagnosis not present

## 2018-05-28 DIAGNOSIS — Z3A24 24 weeks gestation of pregnancy: Secondary | ICD-10-CM | POA: Diagnosis not present

## 2018-06-18 DIAGNOSIS — Z3A27 27 weeks gestation of pregnancy: Secondary | ICD-10-CM | POA: Diagnosis not present

## 2018-06-18 DIAGNOSIS — O365920 Maternal care for other known or suspected poor fetal growth, second trimester, not applicable or unspecified: Secondary | ICD-10-CM | POA: Diagnosis not present

## 2018-06-18 DIAGNOSIS — Z3689 Encounter for other specified antenatal screening: Secondary | ICD-10-CM | POA: Diagnosis not present

## 2018-06-18 DIAGNOSIS — O4442 Low lying placenta NOS or without hemorrhage, second trimester: Secondary | ICD-10-CM | POA: Diagnosis not present

## 2018-06-23 DIAGNOSIS — Z3A27 27 weeks gestation of pregnancy: Secondary | ICD-10-CM | POA: Diagnosis not present

## 2018-06-23 DIAGNOSIS — O365920 Maternal care for other known or suspected poor fetal growth, second trimester, not applicable or unspecified: Secondary | ICD-10-CM | POA: Diagnosis not present

## 2018-06-24 DIAGNOSIS — O365990 Maternal care for other known or suspected poor fetal growth, unspecified trimester, not applicable or unspecified: Secondary | ICD-10-CM | POA: Diagnosis not present

## 2018-06-24 DIAGNOSIS — Z3A28 28 weeks gestation of pregnancy: Secondary | ICD-10-CM | POA: Diagnosis not present

## 2018-06-25 DIAGNOSIS — O09813 Supervision of pregnancy resulting from assisted reproductive technology, third trimester: Secondary | ICD-10-CM | POA: Diagnosis not present

## 2018-06-25 DIAGNOSIS — O365990 Maternal care for other known or suspected poor fetal growth, unspecified trimester, not applicable or unspecified: Secondary | ICD-10-CM | POA: Diagnosis not present

## 2018-06-25 DIAGNOSIS — Z3A28 28 weeks gestation of pregnancy: Secondary | ICD-10-CM | POA: Diagnosis not present

## 2018-06-25 DIAGNOSIS — O0903 Supervision of pregnancy with history of infertility, third trimester: Secondary | ICD-10-CM | POA: Diagnosis not present

## 2018-06-28 DIAGNOSIS — Z23 Encounter for immunization: Secondary | ICD-10-CM | POA: Diagnosis not present

## 2018-06-28 DIAGNOSIS — Z3689 Encounter for other specified antenatal screening: Secondary | ICD-10-CM | POA: Diagnosis not present

## 2018-06-28 DIAGNOSIS — O365990 Maternal care for other known or suspected poor fetal growth, unspecified trimester, not applicable or unspecified: Secondary | ICD-10-CM | POA: Diagnosis not present

## 2018-06-28 DIAGNOSIS — O0903 Supervision of pregnancy with history of infertility, third trimester: Secondary | ICD-10-CM | POA: Diagnosis not present

## 2018-06-28 DIAGNOSIS — Z3A28 28 weeks gestation of pregnancy: Secondary | ICD-10-CM | POA: Diagnosis not present

## 2018-06-28 DIAGNOSIS — O09813 Supervision of pregnancy resulting from assisted reproductive technology, third trimester: Secondary | ICD-10-CM | POA: Diagnosis not present

## 2018-07-01 DIAGNOSIS — Z3A29 29 weeks gestation of pregnancy: Secondary | ICD-10-CM | POA: Diagnosis not present

## 2018-07-01 DIAGNOSIS — O365990 Maternal care for other known or suspected poor fetal growth, unspecified trimester, not applicable or unspecified: Secondary | ICD-10-CM | POA: Diagnosis not present

## 2018-07-01 DIAGNOSIS — O0903 Supervision of pregnancy with history of infertility, third trimester: Secondary | ICD-10-CM | POA: Diagnosis not present

## 2018-07-01 DIAGNOSIS — O09813 Supervision of pregnancy resulting from assisted reproductive technology, third trimester: Secondary | ICD-10-CM | POA: Diagnosis not present

## 2018-07-06 DIAGNOSIS — O365930 Maternal care for other known or suspected poor fetal growth, third trimester, not applicable or unspecified: Secondary | ICD-10-CM | POA: Diagnosis not present

## 2018-07-06 DIAGNOSIS — Z3A29 29 weeks gestation of pregnancy: Secondary | ICD-10-CM | POA: Diagnosis not present

## 2018-07-09 DIAGNOSIS — O365930 Maternal care for other known or suspected poor fetal growth, third trimester, not applicable or unspecified: Secondary | ICD-10-CM | POA: Diagnosis not present

## 2018-07-09 DIAGNOSIS — Z3A30 30 weeks gestation of pregnancy: Secondary | ICD-10-CM | POA: Diagnosis not present

## 2018-07-12 DIAGNOSIS — Z3A30 30 weeks gestation of pregnancy: Secondary | ICD-10-CM | POA: Diagnosis not present

## 2018-07-12 DIAGNOSIS — O365930 Maternal care for other known or suspected poor fetal growth, third trimester, not applicable or unspecified: Secondary | ICD-10-CM | POA: Diagnosis not present

## 2018-07-15 DIAGNOSIS — O365930 Maternal care for other known or suspected poor fetal growth, third trimester, not applicable or unspecified: Secondary | ICD-10-CM | POA: Diagnosis not present

## 2018-07-15 DIAGNOSIS — Z3A31 31 weeks gestation of pregnancy: Secondary | ICD-10-CM | POA: Diagnosis not present

## 2018-07-27 DIAGNOSIS — Z3A32 32 weeks gestation of pregnancy: Secondary | ICD-10-CM | POA: Diagnosis not present

## 2018-07-27 DIAGNOSIS — O365930 Maternal care for other known or suspected poor fetal growth, third trimester, not applicable or unspecified: Secondary | ICD-10-CM | POA: Diagnosis not present

## 2018-08-03 DIAGNOSIS — O365930 Maternal care for other known or suspected poor fetal growth, third trimester, not applicable or unspecified: Secondary | ICD-10-CM | POA: Diagnosis not present

## 2018-08-03 DIAGNOSIS — Z3A33 33 weeks gestation of pregnancy: Secondary | ICD-10-CM | POA: Diagnosis not present

## 2018-08-16 DIAGNOSIS — Z3685 Encounter for antenatal screening for Streptococcus B: Secondary | ICD-10-CM | POA: Diagnosis not present

## 2018-08-16 DIAGNOSIS — O365930 Maternal care for other known or suspected poor fetal growth, third trimester, not applicable or unspecified: Secondary | ICD-10-CM | POA: Diagnosis not present

## 2018-08-16 DIAGNOSIS — Z3A35 35 weeks gestation of pregnancy: Secondary | ICD-10-CM | POA: Diagnosis not present

## 2018-08-16 LAB — OB RESULTS CONSOLE GBS: GBS: NEGATIVE

## 2018-08-23 DIAGNOSIS — Z3A36 36 weeks gestation of pregnancy: Secondary | ICD-10-CM | POA: Diagnosis not present

## 2018-08-23 DIAGNOSIS — O365930 Maternal care for other known or suspected poor fetal growth, third trimester, not applicable or unspecified: Secondary | ICD-10-CM | POA: Diagnosis not present

## 2018-08-23 DIAGNOSIS — R350 Frequency of micturition: Secondary | ICD-10-CM | POA: Diagnosis not present

## 2018-09-02 DIAGNOSIS — O365930 Maternal care for other known or suspected poor fetal growth, third trimester, not applicable or unspecified: Secondary | ICD-10-CM | POA: Diagnosis not present

## 2018-09-02 DIAGNOSIS — Z3A38 38 weeks gestation of pregnancy: Secondary | ICD-10-CM | POA: Diagnosis not present

## 2018-09-08 NOTE — L&D Delivery Note (Signed)
Delivery Note At 11:38 AM a viable and healthy female was delivered via Vaginal, Spontaneous (Presentation:vtx;LOA  ).  APGAR: 9, 9; weight 6lb 11oz .   Placenta status: spontaneous intact sent to path, .  Cord: short, marginal insertion  with the following complications: .  Cord pH: none  Anesthesia:  epidural Episiotomy: None Lacerations: 2nd degree;Perineal Suture Repair: 3.0 chromic Est. Blood Loss (mL): 823  Mom to postpartum.  Baby to Couplet care / Skin to Skin.  Lynn Richards [name] 09/20/2018, 12:40 PM

## 2018-09-10 DIAGNOSIS — Z3A39 39 weeks gestation of pregnancy: Secondary | ICD-10-CM | POA: Diagnosis not present

## 2018-09-10 DIAGNOSIS — O365930 Maternal care for other known or suspected poor fetal growth, third trimester, not applicable or unspecified: Secondary | ICD-10-CM | POA: Diagnosis not present

## 2018-09-15 ENCOUNTER — Other Ambulatory Visit: Payer: Self-pay | Admitting: Obstetrics and Gynecology

## 2018-09-15 DIAGNOSIS — O365930 Maternal care for other known or suspected poor fetal growth, third trimester, not applicable or unspecified: Secondary | ICD-10-CM | POA: Diagnosis not present

## 2018-09-15 DIAGNOSIS — Z3A39 39 weeks gestation of pregnancy: Secondary | ICD-10-CM | POA: Diagnosis not present

## 2018-09-16 ENCOUNTER — Telehealth (HOSPITAL_COMMUNITY): Payer: Self-pay | Admitting: *Deleted

## 2018-09-16 ENCOUNTER — Inpatient Hospital Stay (HOSPITAL_COMMUNITY)
Admission: AD | Admit: 2018-09-16 | Payer: BLUE CROSS/BLUE SHIELD | Source: Ambulatory Visit | Admitting: Obstetrics and Gynecology

## 2018-09-16 ENCOUNTER — Encounter (HOSPITAL_COMMUNITY): Payer: Self-pay | Admitting: *Deleted

## 2018-09-16 NOTE — Telephone Encounter (Signed)
Preadmission screen  

## 2018-09-19 ENCOUNTER — Encounter (HOSPITAL_COMMUNITY): Payer: Self-pay

## 2018-09-19 ENCOUNTER — Inpatient Hospital Stay (HOSPITAL_COMMUNITY)
Admission: RE | Admit: 2018-09-19 | Discharge: 2018-09-22 | DRG: 806 | Disposition: A | Discharge status: Home / Self Care | Payer: BLUE CROSS/BLUE SHIELD | Attending: Obstetrics and Gynecology | Admitting: Obstetrics and Gynecology

## 2018-09-19 DIAGNOSIS — O9081 Anemia of the puerperium: Secondary | ICD-10-CM | POA: Diagnosis not present

## 2018-09-19 DIAGNOSIS — Z3A40 40 weeks gestation of pregnancy: Secondary | ICD-10-CM

## 2018-09-19 DIAGNOSIS — O480 Post-term pregnancy: Principal | ICD-10-CM | POA: Diagnosis present

## 2018-09-19 DIAGNOSIS — D62 Acute posthemorrhagic anemia: Secondary | ICD-10-CM | POA: Diagnosis not present

## 2018-09-19 DIAGNOSIS — Z3490 Encounter for supervision of normal pregnancy, unspecified, unspecified trimester: Secondary | ICD-10-CM | POA: Diagnosis present

## 2018-09-19 DIAGNOSIS — O411230 Chorioamnionitis, third trimester, not applicable or unspecified: Secondary | ICD-10-CM | POA: Diagnosis not present

## 2018-09-19 DIAGNOSIS — O43123 Velamentous insertion of umbilical cord, third trimester: Secondary | ICD-10-CM | POA: Diagnosis not present

## 2018-09-19 LAB — COMPREHENSIVE METABOLIC PANEL
ALT: 14 U/L (ref 0–44)
AST: 20 U/L (ref 15–41)
Albumin: 3.3 g/dL — ABNORMAL LOW (ref 3.5–5.0)
Alkaline Phosphatase: 133 U/L — ABNORMAL HIGH (ref 38–126)
Anion gap: 9 (ref 5–15)
BUN: 9 mg/dL (ref 6–20)
CO2: 21 mmol/L — ABNORMAL LOW (ref 22–32)
Calcium: 9.2 mg/dL (ref 8.9–10.3)
Chloride: 107 mmol/L (ref 98–111)
Creatinine, Ser: 0.51 mg/dL (ref 0.44–1.00)
GFR calc non Af Amer: >60 mL/min (ref >60)
Glucose, Bld: 92 mg/dL (ref 70–99)
Potassium: 3.7 mmol/L (ref 3.5–5.1)
Sodium: 137 mmol/L (ref 135–145)
Total Bilirubin: 0.9 mg/dL (ref 0.3–1.2)
Total Protein: 6.5 g/dL (ref 6.5–8.1)

## 2018-09-19 LAB — CBC
HCT: 37 % (ref 36.0–46.0)
Hemoglobin: 12.6 g/dL (ref 12.0–15.0)
MCH: 31.7 pg (ref 26.0–34.0)
MCHC: 34.1 g/dL (ref 30.0–36.0)
MCV: 93.2 fL (ref 80.0–100.0)
Platelets: 218 10*3/uL (ref 150–400)
RBC: 3.97 MIL/uL (ref 3.87–5.11)
RDW: 13 % (ref 11.5–15.5)
WBC: 8.5 10*3/uL (ref 4.0–10.5)
nRBC: 0 % (ref 0.0–0.2)

## 2018-09-19 LAB — TYPE AND SCREEN
ABO/RH(D): B POS
Antibody Screen: NEGATIVE

## 2018-09-19 LAB — PROTEIN / CREATININE RATIO, URINE
Creatinine, Urine: 38 mg/dL
Total Protein, Urine: <6 mg/dL

## 2018-09-19 LAB — URIC ACID: Uric Acid, Serum: 4.4 mg/dL (ref 2.5–7.1)

## 2018-09-19 LAB — ABO/RH: ABO/RH(D): B POS

## 2018-09-19 MED ORDER — LACTATED RINGERS IV SOLN
INTRAVENOUS | Status: DC
  Administered 2018-09-19 – 2018-09-20 (×5): via INTRAVENOUS

## 2018-09-19 MED ORDER — MISOPROSTOL 50MCG HALF TABLET
50.0000 ug | ORAL_TABLET | ORAL | Status: DC
  Administered 2018-09-19 (×3): 50 ug via ORAL
  Filled 2018-09-19 (×9): qty 1

## 2018-09-19 MED ORDER — TERBUTALINE SULFATE 1 MG/ML IJ SOLN
0.2500 mg | Freq: Once | INTRAMUSCULAR | Status: DC | PRN
  Filled 2018-09-19: qty 1

## 2018-09-19 MED ORDER — OXYTOCIN 40 UNITS IN NORMAL SALINE INFUSION - SIMPLE MED: 2.5000 [IU]/h | INTRAVENOUS | Status: DC

## 2018-09-19 MED ORDER — SOD CITRATE-CITRIC ACID 500-334 MG/5ML PO SOLN: 30.0000 mL | ORAL | Status: DC | PRN

## 2018-09-19 MED ORDER — OXYTOCIN 40 UNITS IN NORMAL SALINE INFUSION - SIMPLE MED
1.0000 m[IU]/min | INTRAVENOUS | Status: DC
  Administered 2018-09-19: 2 m[IU]/min via INTRAVENOUS
  Filled 2018-09-19: qty 1000

## 2018-09-19 MED ORDER — BUTORPHANOL TARTRATE 2 MG/ML IJ SOLN
2.0000 mg | INTRAMUSCULAR | Status: DC | PRN
  Administered 2018-09-19 (×2): 2 mg via INTRAVENOUS
  Filled 2018-09-19 (×2): qty 2

## 2018-09-19 MED ORDER — LACTATED RINGERS IV SOLN
500.0000 mL | INTRAVENOUS | Status: DC | PRN
  Administered 2018-09-19: 500 mL via INTRAVENOUS
  Administered 2018-09-20: 1000 mL via INTRAVENOUS

## 2018-09-19 MED ORDER — ONDANSETRON HCL 4 MG/2ML IJ SOLN: 4.0000 mg | Freq: Four times a day (QID) | INTRAMUSCULAR | Status: DC | PRN

## 2018-09-19 MED ORDER — MISOPROSTOL 25 MCG QUARTER TABLET: 25.0000 ug | ORAL_TABLET | ORAL | Status: DC | PRN

## 2018-09-19 MED ORDER — LIDOCAINE HCL (PF) 1 % IJ SOLN
30.0000 mL | INTRAMUSCULAR | Status: DC | PRN
  Filled 2018-09-19: qty 30

## 2018-09-19 MED ORDER — ACETAMINOPHEN 325 MG PO TABS: 650.0000 mg | ORAL_TABLET | ORAL | Status: DC | PRN

## 2018-09-19 MED ORDER — OXYTOCIN BOLUS FROM INFUSION
500.0000 mL | Freq: Once | INTRAVENOUS | Status: AC
  Administered 2018-09-20: 500 mL via INTRAVENOUS

## 2018-09-19 NOTE — Progress Notes (Signed)
Lynn Richards is a 33 y.o. G1P0 at [redacted]w[redacted]d by ultrasound admitted for induction of labor due to Post dates. Due date 09/16/2018. S/p cytotec x 2  Subjective: No complaint  Objective: BP 135/80   Pulse 99   Temp 98.3 F (36.8 C) (Oral)   Resp 15   Ht 5\' 4"  (1.626 m)   Wt 77.9 kg   BMI 29.49 kg/m  No intake/output data recorded. No intake/output data recorded.  FHT:  FHR: 130 bpm, variability: moderate,  accelerations:  Present,  decelerations:  Absent UC:   irregular,  Tracing: cat 1. irreg ctx VE 4/60/-3 Labs: Lab Results  Component Value Date   WBC 8.5 09/19/2018   HGB 12.6 09/19/2018   HCT 37.0 09/19/2018   MCV 93.2 09/19/2018   PLT 218 09/19/2018    Assessment / Plan: Postdates  IVF pregnancy P) cont with cervical ripening. May ambulate   Anticipated MOD:  NSVD  Lynn Richards 09/19/2018, 2:22 PM

## 2018-09-19 NOTE — Progress Notes (Signed)
Laira Klutz is a 33 y.o. G1P0 at [redacted]w[redacted]d by ultrasound admitted for induction of labor due to Post dates. Due date 09/16/2018.  Subjective: epidural Comfortable  Objective: BP (!) 142/85   Pulse 99   Temp 98.4 F (36.9 C) (Oral)   Resp 15   Ht 5\' 4"  (1.626 m)   Wt 77.9 kg   BMI 29.49 kg/m  No intake/output data recorded. No intake/output data recorded.  FHT:  FHR: 135 bpm, variability: moderate,  accelerations:  Present,  decelerations:  Absent SVE:   4 cm dilated, 70% effaced, -2 station Tracing: baseline 135 (+) accels irreg ctx  Labs: Lab Results  Component Value Date   WBC 8.5 09/19/2018   HGB 12.6 09/19/2018   HCT 37.0 09/19/2018   MCV 93.2 09/19/2018   PLT 218 09/19/2018    Assessment / Plan: latent phase  Postdates P) cont with pitocin. Exaggerated sims position. Defer amniotomy   Anticipated MOD:  NSVD  Roe Wilner A Demara Lover 09/19/2018, 10:11 PM

## 2018-09-19 NOTE — H&P (Signed)
Lynn Richards is a 33 y.o. female presenting for IOL @ 40 3/7 weeks. IVF pregnancy. Pt followed transiently for growth discordancy . OB History    Gravida  1   Para      Term      Preterm      AB      Living        SAB      TAB      Ectopic      Multiple      Live Births             Past Medical History:  Diagnosis Date  . Hydrosalpinx    TUBAL ADHESIONS   Past Surgical History:  Procedure Laterality Date  . LAPAROSCOPIC UNILATERAL SALPINGECTOMY Right 07/21/2017   Procedure:         Hysteroscopic incision of uterine septum, Left tubal catheterization, endometrial biopsy, laparoscopic lysis of adhesions and left salpingoeostomy  ;  Surgeon: Fermin Schwab, MD;  Location: [facility];  Service: Gynecology;  Laterality: Right;  . MULTIPLE TOOTH EXTRACTIONS  2014  . WISDOM TOOTH EXTRACTION  2014   Family History: family history includes Diabetes in her maternal aunt and maternal grandmother; Hyperlipidemia in her maternal grandmother; Hypertension in her maternal grandmother. Social History:  reports that she has never smoked. She has never used smokeless tobacco. She reports that she does not drink alcohol or use drugs.     Maternal Diabetes: No Genetic Screening: Normal Maternal Ultrasounds/Referrals: Normal Fetal Ultrasounds or other Referrals:  Fetal echodue to IVF preg Maternal Substance Abuse:  No Significant Maternal Medications:  None Significant Maternal Lab Results:  Lab values include: Group B Strep negative Other Comments:  IVF preg  Review of Systems  Eyes: Negative for blurred vision.  Gastrointestinal: Negative for heartburn and nausea.  Neurological: Negative for headaches.  All other systems reviewed and are negative.  Maternal Medical History:  Reason for admission: Nausea.    Dilation: 1 Effacement (%): 60 Station: -3 Exam by:: Josselin Gaulin, MD Blood pressure (!) 152/95, pulse (!) 111, temperature 98.8 F (37.1 C),  temperature source Oral, resp. rate 15, height 5\' 4"  (1.626 m), weight 77.9 kg. Exam Physical Exam  Constitutional: She is oriented to person, place, and time. She appears well-developed and well-nourished.  HENT:  Head: Atraumatic.  Eyes: EOM are normal.  Neck: Neck supple.  Cardiovascular: Regular rhythm.  Respiratory: Breath sounds normal.  GI: Soft.  Musculoskeletal:        General: Edema present.  Neurological: She is alert and oriented to person, place, and time.  Skin: Skin is warm and dry.  Psychiatric: She has a normal mood and affect.    Prenatal labs: ABO, Rh: B/Positive/-- (06/12 0000) Antibody: Negative (06/12 0000) Rubella: Immune (06/12 0000) RPR: Nonreactive (06/12 0000)  HBsAg: Negative (06/12 0000)  HIV: Non-reactive (06/12 0000)  GBS: Negative (12/09 0000)   Assessment/Plan: Postdates IVF pregnancy P) admit routine labs.repeat BP. If still elev. PIH labs. Intracervical balloon placed. Oral cytotec. Epidural planned.    Odell Fasching A Raima Geathers 09/19/2018, 7:52 AM

## 2018-09-20 ENCOUNTER — Inpatient Hospital Stay (HOSPITAL_COMMUNITY): Payer: BLUE CROSS/BLUE SHIELD | Admitting: Anesthesiology

## 2018-09-20 ENCOUNTER — Other Ambulatory Visit: Payer: Self-pay

## 2018-09-20 ENCOUNTER — Encounter (HOSPITAL_COMMUNITY): Payer: Self-pay

## 2018-09-20 LAB — RPR: RPR Ser Ql: NONREACTIVE

## 2018-09-20 MED ORDER — BENZOCAINE-MENTHOL 20-0.5 % EX AERO
1.0000 "application " | INHALATION_SPRAY | CUTANEOUS | Status: DC | PRN
  Administered 2018-09-20: 1 via TOPICAL
  Filled 2018-09-20 (×2): qty 56

## 2018-09-20 MED ORDER — ONDANSETRON HCL 4 MG PO TABS: 4.0000 mg | ORAL_TABLET | ORAL | Status: DC | PRN

## 2018-09-20 MED ORDER — DIBUCAINE 1 % RE OINT: 1.0000 "application " | TOPICAL_OINTMENT | RECTAL | Status: DC | PRN

## 2018-09-20 MED ORDER — SENNOSIDES-DOCUSATE SODIUM 8.6-50 MG PO TABS
2.0000 | ORAL_TABLET | ORAL | Status: DC
  Administered 2018-09-20 – 2018-09-22 (×2): 2 via ORAL
  Filled 2018-09-20 (×2): qty 2

## 2018-09-20 MED ORDER — EPHEDRINE 5 MG/ML INJ
10.0000 mg | INTRAVENOUS | Status: DC | PRN
  Filled 2018-09-20: qty 2

## 2018-09-20 MED ORDER — ACETAMINOPHEN 325 MG PO TABS
650.0000 mg | ORAL_TABLET | ORAL | Status: DC | PRN
  Administered 2018-09-20: 650 mg via ORAL
  Filled 2018-09-20: qty 2

## 2018-09-20 MED ORDER — PRENATAL MULTIVITAMIN CH
1.0000 | ORAL_TABLET | Freq: Every day | ORAL | Status: DC
  Administered 2018-09-21: 1 via ORAL
  Filled 2018-09-20 (×2): qty 1

## 2018-09-20 MED ORDER — PHENYLEPHRINE 40 MCG/ML (10ML) SYRINGE FOR IV PUSH (FOR BLOOD PRESSURE SUPPORT)
80.0000 ug | PREFILLED_SYRINGE | INTRAVENOUS | Status: DC | PRN
  Filled 2018-09-20: qty 10

## 2018-09-20 MED ORDER — ONDANSETRON HCL 4 MG/2ML IJ SOLN: 4.0000 mg | INTRAMUSCULAR | Status: DC | PRN

## 2018-09-20 MED ORDER — FERROUS SULFATE 325 (65 FE) MG PO TABS
325.0000 mg | ORAL_TABLET | Freq: Two times a day (BID) | ORAL | Status: DC
  Administered 2018-09-20 – 2018-09-22 (×4): 325 mg via ORAL
  Filled 2018-09-20 (×4): qty 1

## 2018-09-20 MED ORDER — FENTANYL 2.5 MCG/ML BUPIVACAINE 1/10 % EPIDURAL INFUSION (WH - ANES)
14.0000 mL/h | INTRAMUSCULAR | Status: DC | PRN
  Administered 2018-09-20 (×2): 14 mL/h via EPIDURAL
  Filled 2018-09-20 (×2): qty 100

## 2018-09-20 MED ORDER — SIMETHICONE 80 MG PO CHEW: 80.0000 mg | CHEWABLE_TABLET | ORAL | Status: DC | PRN

## 2018-09-20 MED ORDER — COCONUT OIL OIL: 1.0000 "application " | TOPICAL_OIL | Status: DC | PRN

## 2018-09-20 MED ORDER — LIDOCAINE HCL (PF) 1 % IJ SOLN
INTRAMUSCULAR | Status: DC | PRN
  Administered 2018-09-20 (×2): 4 mL via EPIDURAL

## 2018-09-20 MED ORDER — MISOPROSTOL 200 MCG PO TABS
ORAL_TABLET | ORAL | Status: AC
  Administered 2018-09-20: 800 ug via RECTAL
  Filled 2018-09-20: qty 4

## 2018-09-20 MED ORDER — TRANEXAMIC ACID-NACL 1000-0.7 MG/100ML-% IV SOLN
INTRAVENOUS | Status: AC
  Administered 2018-09-20: 1000 mg via INTRAVENOUS
  Filled 2018-09-20: qty 100

## 2018-09-20 MED ORDER — LACTATED RINGERS IV SOLN: 500.0000 mL | Freq: Once | INTRAVENOUS | Status: DC

## 2018-09-20 MED ORDER — DIPHENHYDRAMINE HCL 50 MG/ML IJ SOLN: 12.5000 mg | INTRAMUSCULAR | Status: DC | PRN

## 2018-09-20 MED ORDER — ERYTHROMYCIN 5 MG/GM OP OINT
TOPICAL_OINTMENT | OPHTHALMIC | Status: AC
  Filled 2018-09-20: qty 1

## 2018-09-20 MED ORDER — IBUPROFEN 600 MG PO TABS
600.0000 mg | ORAL_TABLET | Freq: Four times a day (QID) | ORAL | Status: DC
  Administered 2018-09-20 – 2018-09-22 (×7): 600 mg via ORAL
  Filled 2018-09-20 (×8): qty 1

## 2018-09-20 MED ORDER — MISOPROSTOL 200 MCG PO TABS
800.0000 ug | ORAL_TABLET | Freq: Once | ORAL | Status: AC
  Administered 2018-09-20: 800 ug via RECTAL

## 2018-09-20 MED ORDER — DIPHENHYDRAMINE HCL 25 MG PO CAPS: 25.0000 mg | ORAL_CAPSULE | Freq: Four times a day (QID) | ORAL | Status: DC | PRN

## 2018-09-20 MED ORDER — PHENYLEPHRINE 40 MCG/ML (10ML) SYRINGE FOR IV PUSH (FOR BLOOD PRESSURE SUPPORT)
80.0000 ug | PREFILLED_SYRINGE | INTRAVENOUS | Status: DC | PRN
  Filled 2018-09-20 (×2): qty 10

## 2018-09-20 MED ORDER — ZOLPIDEM TARTRATE 5 MG PO TABS: 5.0000 mg | ORAL_TABLET | Freq: Every evening | ORAL | Status: DC | PRN

## 2018-09-20 MED ORDER — TRANEXAMIC ACID-NACL 1000-0.7 MG/100ML-% IV SOLN
1000.0000 mg | INTRAVENOUS | Status: AC
  Administered 2018-09-20: 1000 mg via INTRAVENOUS

## 2018-09-20 MED ORDER — ACETAMINOPHEN 500 MG PO TABS
1000.0000 mg | ORAL_TABLET | Freq: Once | ORAL | Status: AC
  Administered 2018-09-20: 1000 mg via ORAL
  Filled 2018-09-20: qty 2

## 2018-09-20 MED ORDER — WITCH HAZEL-GLYCERIN EX PADS: 1.0000 "application " | MEDICATED_PAD | CUTANEOUS | Status: DC | PRN

## 2018-09-20 NOTE — Anesthesia Postprocedure Evaluation (Signed)
Anesthesia Post Note  Patient: Radiation protection practitioner) Performed: AN AD HOC LABOR EPIDURAL     Patient location during evaluation: Mother Baby Anesthesia Type: Epidural Level of consciousness: awake, awake and alert and oriented Pain management: pain level controlled Vital Signs Assessment: post-procedure vital signs reviewed and stable Respiratory status: spontaneous breathing, nonlabored ventilation and respiratory function stable Cardiovascular status: stable Postop Assessment: no headache, no backache, patient able to bend at knees, no apparent nausea or vomiting, adequate PO intake and able to ambulate Anesthetic complications: no    Last Vitals:  Vitals:   09/20/18 1430 09/20/18 1614  BP: 115/64   Pulse: (!) 119   Resp: 18   Temp: (!) 38.3 C 37.1 C  SpO2:      Last Pain:  Vitals:   09/20/18 1614  TempSrc: Oral  PainSc:    Pain Goal: Patients Stated Pain Goal: 4 (09/19/18 0956)               Lakechia Nay

## 2018-09-20 NOTE — Anesthesia Procedure Notes (Signed)
Epidural Patient location during procedure: OB Start time: 09/20/2018 1:00 AM End time: 09/20/2018 1:03 AM  Staffing Anesthesiologist: Kaylyn Layer, MD Performed: anesthesiologist   Preanesthetic Checklist Completed: patient identified, pre-op evaluation, timeout performed, IV checked, risks and benefits discussed and monitors and equipment checked  Epidural Patient position: sitting Prep: site prepped and draped and DuraPrep Patient monitoring: continuous pulse ox, blood pressure, heart rate and cardiac monitor Approach: midline Location: L3-L4 Injection technique: LOR air  Needle:  Needle type: Tuohy  Needle gauge: 17 G Needle length: 9 cm Needle insertion depth: 5 cm Catheter type: closed end flexible Catheter size: 19 Gauge Catheter at skin depth: 10 cm Test dose: negative and Other (1% lidocaine)  Assessment Events: blood not aspirated, injection not painful, no injection resistance, negative IV test and no paresthesia  Additional Notes Patient identified. Risks, benefits, and alternatives discussed with patient including but not limited to bleeding, infection, nerve damage, paralysis, failed block, incomplete pain control, headache, blood pressure changes, nausea, vomiting, reactions to medication, itching, and postpartum back pain. Confirmed with bedside nurse the patient's most recent platelet count. Confirmed with patient that they are not currently taking any anticoagulation, have any bleeding history, or any family history of bleeding disorders. Patient expressed understanding and wished to proceed. All questions were answered. Sterile technique was used throughout the entire procedure. Crisp LOR on first pass. Please see nursing notes for vital signs. Test dose was given through epidural catheter and negative prior to continuing to dose epidural or start infusion. Warning signs of high block given to the patient including shortness of breath, tingling/numbness in  hands, complete motor block, or any concerning symptoms with instructions to call for help. Patient was given instructions on fall risk and not to get out of bed. All questions and concerns addressed with instructions to call with any issues or inadequate analgesia.  Reason for block:procedure for pain

## 2018-09-20 NOTE — Anesthesia Preprocedure Evaluation (Signed)
Anesthesia Evaluation  Patient identified by MRN, date of birth, ID band Patient awake    Reviewed: Allergy & Precautions, Patient's Chart, lab work & pertinent test results  History of Anesthesia Complications Negative for: history of anesthetic complications  Airway Mallampati: II  TM Distance: >3 FB Neck ROM: Full    Dental  (+) Teeth Intact   Pulmonary neg pulmonary ROS,    Pulmonary exam normal breath sounds clear to auscultation       Cardiovascular negative cardio ROS Normal cardiovascular exam Rhythm:Regular Rate:Normal     Neuro/Psych negative neurological ROS  negative psych ROS   GI/Hepatic negative GI ROS, Neg liver ROS,   Endo/Other  negative endocrine ROS  Renal/GU negative Renal ROS  negative genitourinary   Musculoskeletal negative musculoskeletal ROS (+)   Abdominal   Peds negative pediatric ROS (+)  Hematology negative hematology ROS (+)   Anesthesia Other Findings   Reproductive/Obstetrics (+) Pregnancy                             Anesthesia Physical Anesthesia Plan  ASA: II  Anesthesia Plan: Epidural   Post-op Pain Management:    Induction:   PONV Risk Score and Plan: Treatment may vary due to age or medical condition  Airway Management Planned: Natural Airway  Additional Equipment:   Intra-op Plan:   Post-operative Plan:   Informed Consent: I have reviewed the patients History and Physical, chart, labs and discussed the procedure including the risks, benefits and alternatives for the proposed anesthesia with the patient or authorized representative who has indicated his/her understanding and acceptance.       Plan Discussed with: CRNA  Anesthesia Plan Comments:         Anesthesia Quick Evaluation  

## 2018-09-20 NOTE — Progress Notes (Signed)
Patient having a cramping feeling on upper left abdomen, states it feels a 'little better' after drinking ginger ale and burping.

## 2018-09-20 NOTE — Progress Notes (Signed)
S; comfortable  O: pitocin 18 miu T100.3 VE fully (100%)+1 station asynclitic AROM mod mec IUPC placed  Tracing: baseline 150 min variability. (+) variable with ctx Ctx q 1-4 mins Concentrated urine IMP: complete  Postdate P) tylenol 1000mg  po x 1. IVF ( 1 L). Right exaggerated sims position. Oral hydration

## 2018-09-20 NOTE — Anesthesia Pain Management Evaluation Note (Signed)
  CRNA Pain Management Visit Note  Patient: Lynn Richards, 33 y.o., female  "Hello I am a member of the anesthesia team at [facility]. We have an anesthesia team available at all times to provide care throughout the hospital, including epidural management and anesthesia for C-section. I don't know your plan for the delivery whether it a natural birth, water birth, IV sedation, nitrous supplementation, doula or epidural, but we want to meet your pain goals."   1.Was your pain managed to your expectations on prior hospitalizations?   No prior hospitalizations  2.What is your expectation for pain management during this hospitalization?     Epidural  3.How can we help you reach that goal?Maintain epidural until delivery of infant.  Record the patient's initial score and the patient's pain goal.   Pain: 0  Pain Goal: 0 [facility] wants you to be able to say your pain was always managed very well.  Candra Wegner 09/20/2018

## 2018-09-21 LAB — CBC
HCT: 31.6 % — ABNORMAL LOW (ref 36.0–46.0)
Hemoglobin: 10.8 g/dL — ABNORMAL LOW (ref 12.0–15.0)
MCH: 31.6 pg (ref 26.0–34.0)
MCHC: 34.2 g/dL (ref 30.0–36.0)
MCV: 92.4 fL (ref 80.0–100.0)
Platelets: 196 10*3/uL (ref 150–400)
RBC: 3.42 MIL/uL — ABNORMAL LOW (ref 3.87–5.11)
RDW: 13.1 % (ref 11.5–15.5)
WBC: 21.6 10*3/uL — ABNORMAL HIGH (ref 4.0–10.5)
nRBC: 0 % (ref 0.0–0.2)

## 2018-09-21 NOTE — Progress Notes (Signed)
PPD #1, SVD, 2nd degree repair, baby girl   S:  Reports feeling good, minimal discomfort             Tolerating po/ No nausea or vomiting / Denies dizziness or SOB             Bleeding is light             Pain controlled with Motrin             Up ad lib / ambulatory / voiding QS  Newborn breast feeding - having some difficulty with latching; baby sleepy  O:               VS: BP 120/90 (BP Location: Left Arm)   Pulse 82   Temp 98.3 F (36.8 C) (Oral)   Resp 16   Ht 5\' 4"  (1.626 m)   Wt 77.9 kg   SpO2 97%   Breastfeeding Unknown   BMI 29.49 kg/m    LABS:              Recent Labs    09/19/18 0733 09/21/18 0536  WBC 8.5 21.6*  HGB 12.6 10.8*  PLT 218 196               Blood type: --/--/B POS, B POS Performed at [facility], [address], [place], [place] [address]  [phone])  Rubella: Immune (06/12 0000)                     I&O: Intake/Output      01/13 0701 - 01/14 0700 01/14 0701 - 01/15 0700   Urine (mL/kg/hr) 675 (0.4)    Blood 823    Total Output 1498    Net -1498         Urine Occurrence 3 x                  Physical Exam:             Alert and oriented X3  Lungs: Clear and unlabored  Heart: regular rate and rhythm / no murmurs  Abdomen: soft, non-tender, non-distended              Fundus: firm, non-tender, U-E  Perineum: well approximated 2nd degree repair, mild edema, no erythema, no ecchymosis  Lochia: small, no clots   Extremities: +1 BLE edema, no calf pain or tenderness, TED hose on    A: PPD # 1, SVD  2nd degree repair   Dependent edema   Mild ABL Anemia - stable, asymptomatic on oral FE  Doing well - stable status  P: Routine post partum orders  Increase water intake  See lactation today  Encouraged to rest when baby rests  Anticipate d/c home tomorrow   Carlean Jews, MSN, CNM [facility]

## 2018-09-21 NOTE — Lactation Note (Signed)
This note was copied from a baby's chart. Lactation Consultation Note  Patient Name: Girl [place] Today's Date: 09/21/2018 Reason for consult: Initial assessment;1st time breastfeeding;Term P1, 13 hour female infant. Mom feels breastfeeding is going well. Per parents, infant had one stool since delivery. Mom has breastfeed 4 times since delivery. Mom has Sprectra 2 DEBP at home. Per mom, she attended breastfeeding classes during her pregnancy. Mom latched infant to right breast using the cross cradle hold,  audible swallows observed, infant breastfeed for 20 minutes and was still breastfeeding when LC left room. Mom knows to breastfeed according hunger cues, not exceed 3 hours without breastfeeding infant. LC discussed I & O. Reviewed Baby & Me book's Breastfeeding Basics.  Mom made aware of O/P services, breastfeeding support groups, community resources, and our phone # for post-discharge questions.    Maternal Data Formula Feeding for Exclusion: No Has patient been taught Hand Expression?: Yes Does the patient have breastfeeding experience prior to this delivery?: No  Feeding Feeding Type: Breast Fed  LATCH Score Latch: Grasps breast easily, tongue down, lips flanged, rhythmical sucking.  Audible Swallowing: Spontaneous and intermittent  Type of Nipple: Everted at rest and after stimulation  Comfort (Breast/Nipple): Soft / non-tender  Hold (Positioning): Assistance needed to correctly position infant at breast and maintain latch.  LATCH Score: 9  Interventions Interventions: Breast feeding basics reviewed;Assisted with latch;Skin to skin;Hand express;Breast massage;Breast compression;Position options;Expressed milk;Support pillows  Lactation Tools Discussed/Used WIC Program: No   Consult Status Consult Status: Follow-up Date: 09/22/18 Follow-up type: In-patient    Danelle Earthly 09/21/2018, 1:05 AM

## 2018-09-22 MED ORDER — IBUPROFEN 600 MG PO TABS: 600.0000 mg | ORAL_TABLET | Freq: Four times a day (QID) | ORAL | 0 refills | Status: AC

## 2018-09-22 NOTE — Lactation Note (Signed)
This note was copied from a baby's chart. Lactation Consultation Note  Patient Name: Girl [place]land Renshaw Today's Date: 09/22/2018 Reason for consult: Follow-up assessment;Term;Primapara;1st time breastfeeding  P1 mother whose infant is now 29 hours old.  Baby was in bassinet when I arrived and not showing feeding cues.  Mother stated that she feels like breast feeding is going well.  Her breasts are soft and non tender and nipples are intact.    Baby was jaundiced and spoke to parents about how they could place her in front of a window in the sunlight when they were home.  Mother already had her in the sunlight in her room.  Encouraged mother to feed frequently and at least 8-12 times/24 hours or sooner if baby shows feeding cues.  Mother had no further questions/concerns.  Engorgement prevention/treatment discussed.  Provided a manual pump with instructions for use.  #24 flange size is appropriate at this time.  Mother has our OP phone number for questions/concerns after discharge.  Informed her of our support groups.  Father present.   Maternal Data Formula Feeding for Exclusion: No Has patient been taught Hand Expression?: Yes Does the patient have breastfeeding experience prior to this delivery?: No  Feeding    LATCH Score                   Interventions    Lactation Tools Discussed/Used WIC Program: No Pump Review: Setup, frequency, and cleaning;Milk Storage Initiated by:: Maicol Bowland Date initiated:: 09/22/18   Consult Status Consult Status: Complete Date: 09/22/18 Follow-up type: Call as needed    Tenisha Fleece R Joreen Swearingin 09/22/2018, 12:06 PM

## 2018-09-22 NOTE — Progress Notes (Addendum)
PPD #2,SVD, 2nd degree repair, baby girl "Mylia"  S:  Reports feeling good, ready to go home             Tolerating po/ No nausea or vomiting / Denies dizziness or SOB             Bleeding is light             Pain controlled with Motrin and Tylenol             Up ad lib / ambulatory / voiding QS  Newborn breast feeding - cluster feeding; going well today   O:               VS: BP 114/80 (BP Location: Left Arm)   Pulse 81   Temp 98.8 F (37.1 C) (Oral)   Resp 18   Ht 5\' 4"  (1.626 m)   Wt 77.9 kg   SpO2 97%   Breastfeeding Unknown   BMI 29.49 kg/m    LABS:              Recent Labs    09/21/18 0536  WBC 21.6*  HGB 10.8*  PLT 196               Blood type: --/--/B POS, B POS Performed at [facility], [address] [place], [place] [address]  [phone])  Rubella: Immune (06/12 0000)                     I&O: Intake/Output      01/14 0701 - 01/15 0700 01/15 0701 - 01/16 0700   Urine (mL/kg/hr)     Blood     Total Output     Net                        Physical Exam:             Alert and oriented X3  Lungs: Clear and unlabored  Heart: regular rate and rhythm / no murmurs  Abdomen: soft, non-tender, non-distended              Fundus: firm, non-tender, U-2  Perineum: well approximated 2nd degree repair, mild edema, no erythema, no ecchymosis  Lochia: small, no clots   Extremities: trace BLE edema, no calf pain or tenderness    A:         PPD # 2, SVD             2nd degree repair              Dependent edema              Mild ABL Anemia - stable, asymptomatic on oral FE             Doing well - stable status  P: Routine post partum orders  Discharge home   WOB discharge book, instructions given, and warning s/s reviewed   Continue home rx for oral FE  Warm water sitz baths 2-3 times per day PRN   F/u with Dr. Cherly Hensen in 6 weeks; Family connects nurse visit in 1 week for BP check   Carlean Jews, MSN, CNM [facility]

## 2018-09-22 NOTE — Discharge Summary (Signed)
Obstetric Discharge Summary   Patient Name: Lynn Richards DOB: 03/14/1986 MRN: [id number]  Date of Admission: 09/19/2018 Date of Discharge: 09/22/2018 Date of Delivery: 09/20/2018 Gestational Age at Delivery: 515w4d  [facility]  Antepartum complications:  - IVF pregnancy  - Transient growth discordancy  Prenatal Labs:   ABO, Rh: B/Positive/-- (06/12 0000) Antibody: Negative (06/12 0000) Rubella: Immune (06/12 0000) RPR: Nonreactive (06/12 0000)  HBsAg: Negative (06/12 0000)  HIV: Non-reactive (06/12 0000)  GBS: Negative (12/09 0000 Admitting Diagnosis: IOL for postdates   Secondary Diagnoses: Patient Active Problem List   Diagnosis Date Noted  . Postpartum care following vaginal delivery (1/13) 09/20/2018  . Encounter for induction of labor 09/19/2018   Induction: Foley bulb, oral Cytotec Augmentation: AROM, Pitocin Complications: None  Date of Delivery: 09/20/2018 Delivered By: Dr. Cherly Hensenousins Delivery Type: spontaneous vaginal delivery Anesthesia: epidural Placenta: sponatneous Laceration: 2nd degree perineal  Episiotomy: none  Newborn Data: Live born female  Birth Weight: 6 lb 11.2 oz (3040 g) APGAR: 9, 9  Newborn Delivery   Birth date/time:  09/20/2018 11:38:00 Delivery type:  Vaginal, Spontaneous      Hospital/Postpartum Course  (Vaginal Delivery): Pt. admited for IOL for postdates.  See notes and delivery summary for further details.  She had transient elevated BP during labor, but resolved PP. PIH labs WNL and no evidence of PEC. Patient had an uncomplicated postpartum course.  By time of discharge on PPD#2, her pain was controlled on oral pain medications; she had appropriate lochia and was ambulating, voiding without difficulty and tolerating regular diet.  She was deemed stable for discharge to home.     Labs: CBC Latest Ref Rng & Units 09/21/2018 09/19/2018 07/21/2017  WBC 4.0 - 10.5 K/uL 21.6(H) 8.5 -  Hemoglobin 12.0 - 15.0  g/dL 10.8(L) 12.6 12.0  Hematocrit 36.0 - 46.0 % 31.6(L) 37.0 -  Platelets 150 - 400 K/uL 196 218 -   Conflict (See Lab Report): B POS/B POS Performed at [facility], [address] [place], [place] [address]   Physical exam:  BP 114/80 (BP Location: Left Arm)   Pulse 81   Temp 98.8 F (37.1 C) (Oral)   Resp 18   Ht 5\' 4"  (1.626 m)   Wt 77.9 kg   SpO2 97%   Breastfeeding Unknown   BMI 29.49 kg/m  General: alert and no distress Pulm: normal respiratory effort Lochia: appropriate Abdomen: soft, NT Uterine Fundus: firm, below umbilicus Perineum: well approximated 2nd degree repair, mild edema, no erythema, no ecchymosis Extremities: No evidence of DVT seen on physical exam. Trace lower extremity edema.   Disposition: stable, discharge to home Baby Feeding: breast milk Baby Disposition: home with mom  Contraception: not discussed, IVF pregnancy   Rh Immune globulin given: N/A Rubella vaccine given: N/A Tdap vaccine given in AP or PP setting: UTD 2019 Flu vaccine given in AP or PP setting: declined    Plan:  [place] Par[place] was discharged to home in good condition. Follow-up appointment at [facility] in 6 weeks. [facility] nurse visit in 1 week for BP check.   Discharge Instructions: Per After Visit Summary. Refer to After Visit Summary and [facility]  Activity: Advance as tolerated. Pelvic rest for 6 weeks.   Diet: Regular, Heart Healthy Discharge Medications: Allergies as of 09/22/2018   No Known Allergies     Medication List    TAKE these medications   ferrous sulfate 325 (65 FE) MG tablet Take  325 mg by mouth daily with breakfast.   ibuprofen 600 MG tablet Commonly known as:  ADVIL,MOTRIN Take 1 tablet (600 mg total) by mouth every 6 (six) hours.   PRENATAL VITAMIN PO Take 1 tablet by mouth daily.            Discharge Care Instructions  (From admission, onward)         Start     Ordered   09/22/18 0000   Discharge wound care:    Comments:  Warm water sitz baths 2-3 times per day as needed   09/22/18 1053         Outpatient follow up:  Follow-up Information    Maxie Better, MD. Schedule an appointment as soon as possible for a visit in 1 week(s).   Specialty:  Obstetrics and Gynecology Why:  Family connects nurse visit in 1 week for BP check, then 6 week Postpartum visit Contact information: [address] [place] [address] [phone]           Signed:  Carlean Jews, MSN, CNM [facility]

## 2018-11-02 DIAGNOSIS — Z130 Encounter for screening for diseases of the blood and blood-forming organs and certain disorders involving the immune mechanism: Secondary | ICD-10-CM | POA: Diagnosis not present

## 2018-11-24 IMAGING — RF DG HYSTEROGRAM
2 series · 4 of 4 positions shown · non-contrast
Comparison: None.

CLINICAL DATA: Fertility testing.

EXAM:
HYSTEROSALPINGOGRAM
TECHNIQUE: Hysterosalpingogram was performed by the ordering physician under
fluoroscopy. Fluoroscopic images were submitted for radiologic
interpretation following the procedure. Please see the procedural
report for the amount of contrast and the fluoroscopy time utilized.

[Series 1: run · 2 of 2 slices shown (1 of 2)]
[im 1/2]
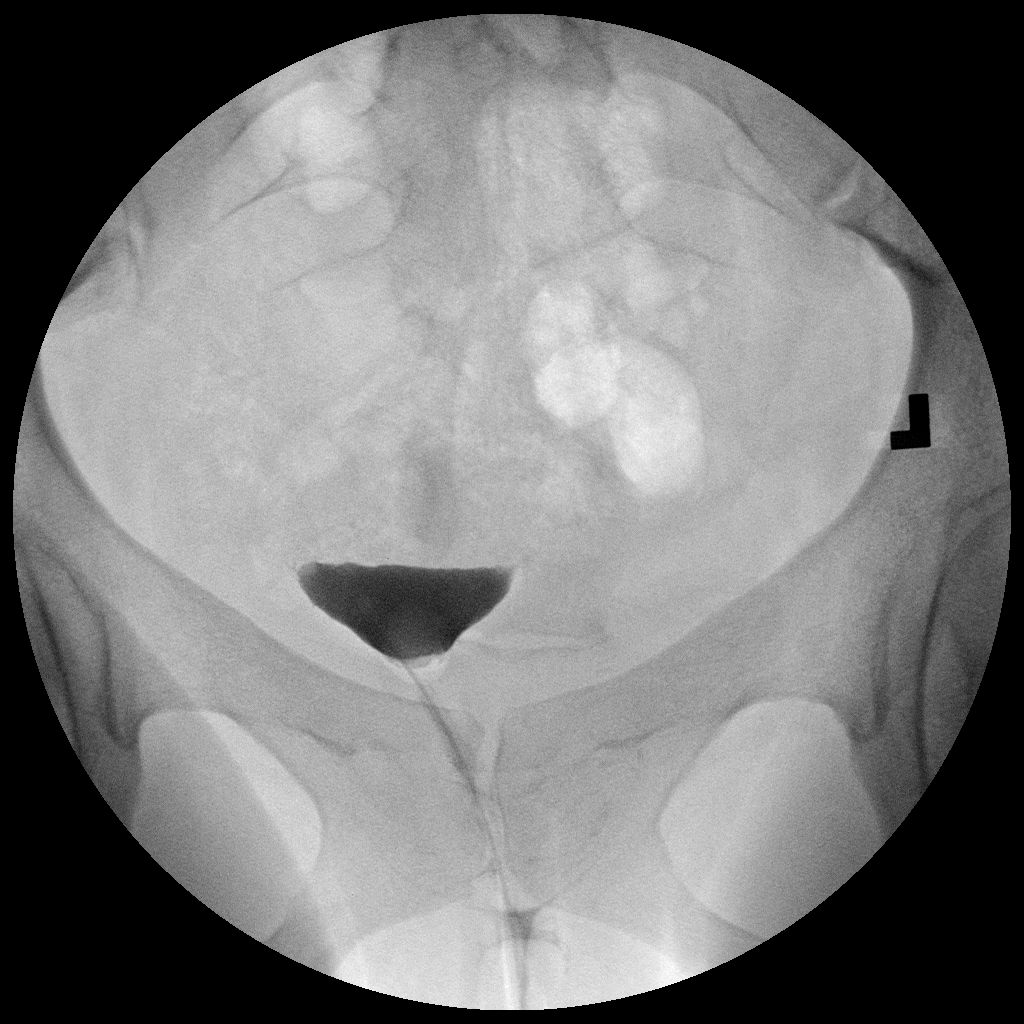
[im 2/2]
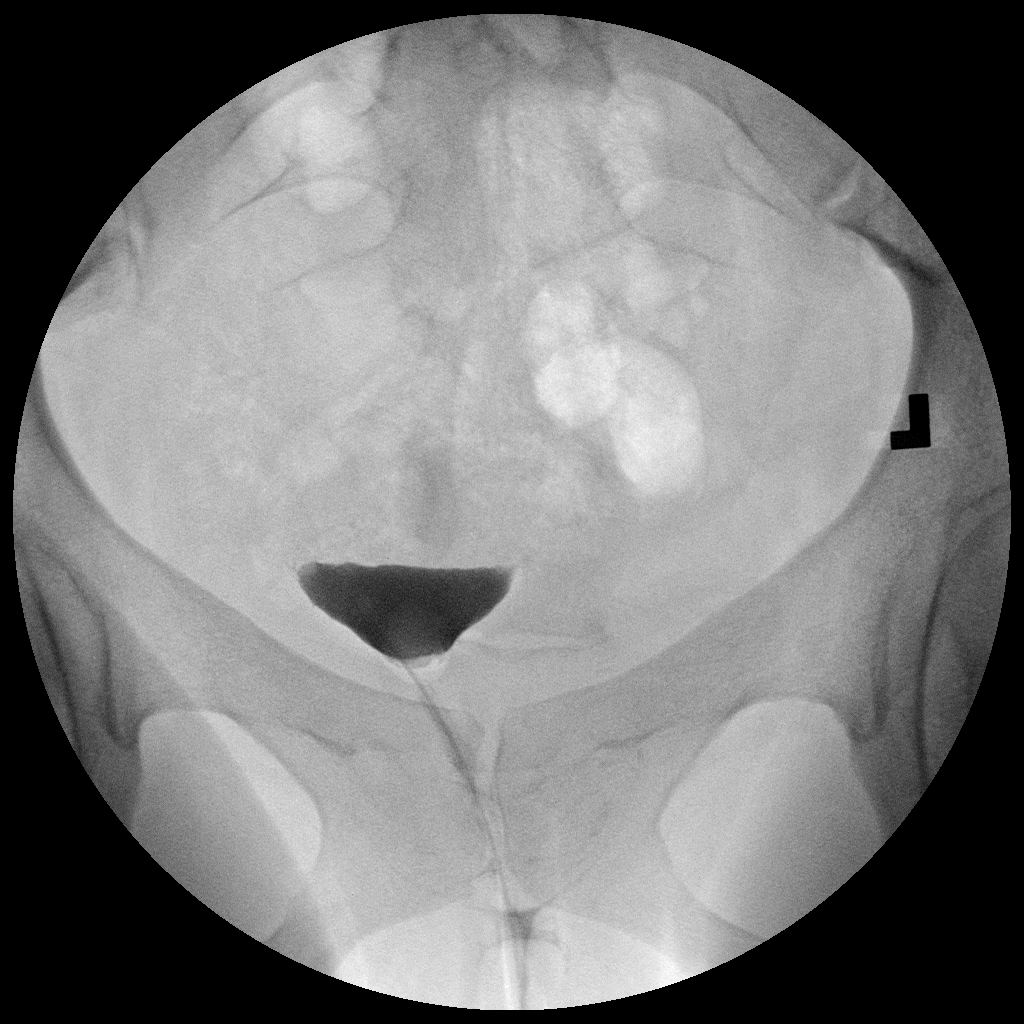

[Series 2: run · 2 of 2 slices shown (2 of 2)]
[im 1/2]
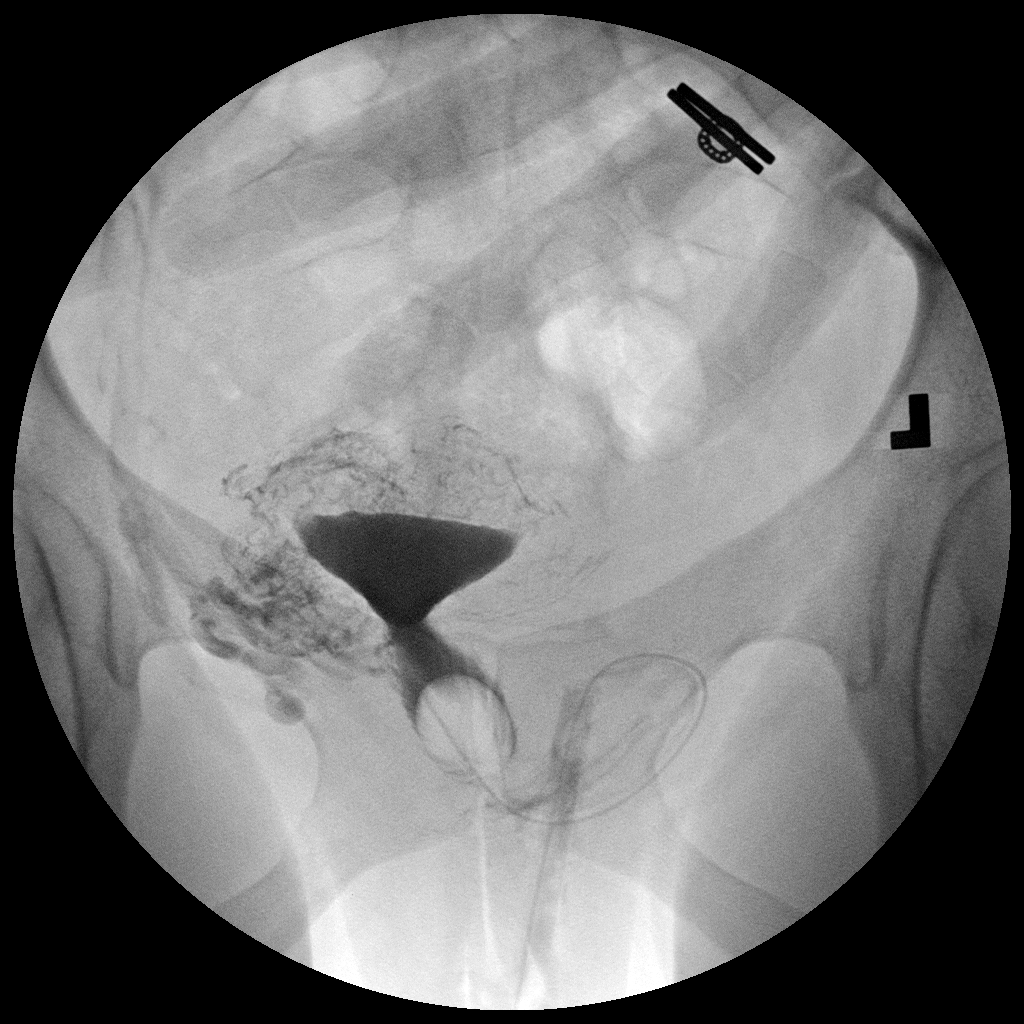
[im 2/2]
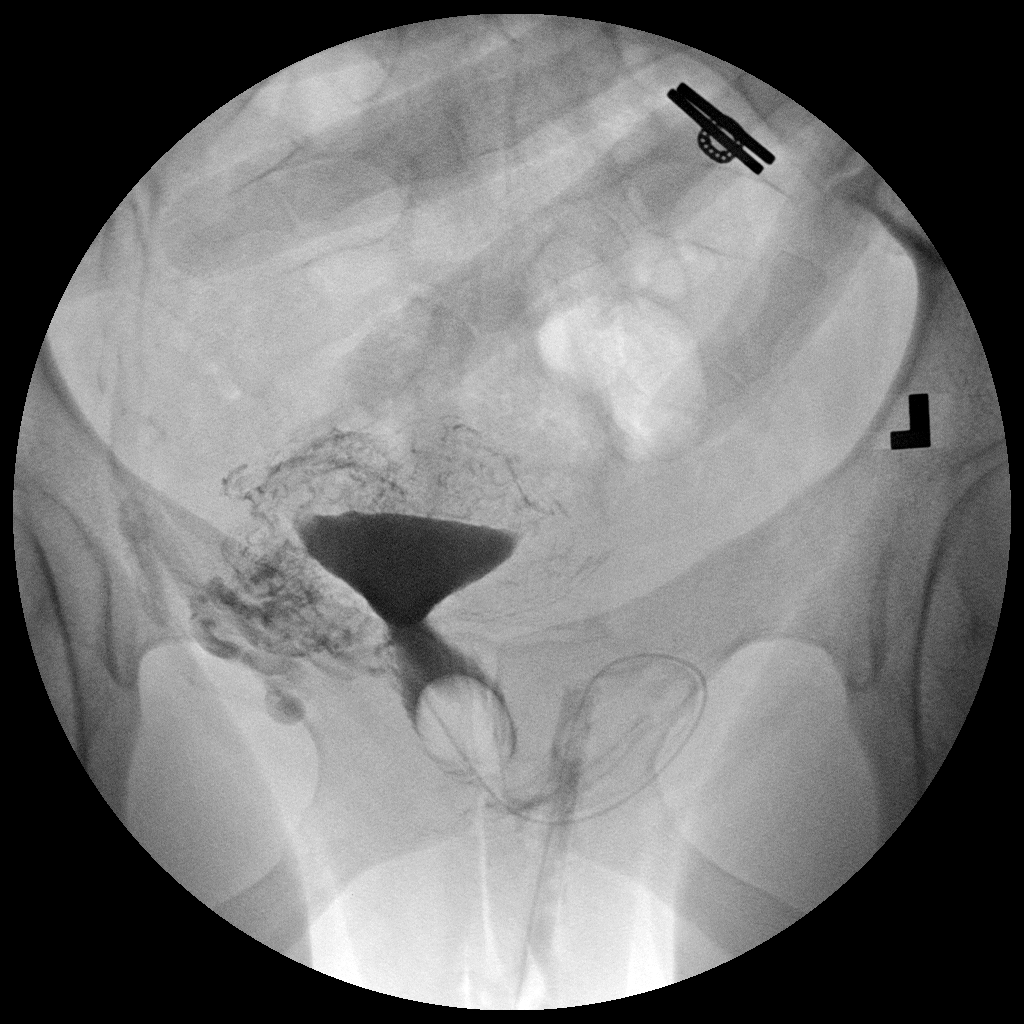

[4 of 4 positions shown; findings below may reference images not displayed]

FINDINGS: Endometrial Cavity: Normal appearance. No signs of Mullerian duct
anomaly or other significant abnormality.

Right Fallopian Tube: No contrast opacification seen.

Left Fallopian Tube: No contrast opacification seen.

Other:  Prominent lymphovenous intravasation of contrast seen.
IMPRESSION: No contrast opacification of either fallopian tube, consistent with
proximal bilateral tubal occlusions.

## 2019-09-30 DIAGNOSIS — N760 Acute vaginitis: Secondary | ICD-10-CM | POA: Diagnosis not present

## 2019-09-30 DIAGNOSIS — B373 Candidiasis of vulva and vagina: Secondary | ICD-10-CM | POA: Diagnosis not present

## 2019-11-08 DIAGNOSIS — Z1151 Encounter for screening for human papillomavirus (HPV): Secondary | ICD-10-CM | POA: Diagnosis not present

## 2019-11-08 DIAGNOSIS — Z01419 Encounter for gynecological examination (general) (routine) without abnormal findings: Secondary | ICD-10-CM | POA: Diagnosis not present

## 2019-11-08 DIAGNOSIS — Z6824 Body mass index (BMI) 24.0-24.9, adult: Secondary | ICD-10-CM | POA: Diagnosis not present

## 2020-01-02 DIAGNOSIS — Z3689 Encounter for other specified antenatal screening: Secondary | ICD-10-CM | POA: Diagnosis not present

## 2020-01-02 DIAGNOSIS — Z3200 Encounter for pregnancy test, result unknown: Secondary | ICD-10-CM | POA: Diagnosis not present

## 2020-01-18 DIAGNOSIS — Z640 Problems related to unwanted pregnancy: Secondary | ICD-10-CM | POA: Diagnosis not present

## 2020-07-15 ENCOUNTER — Encounter (HOSPITAL_COMMUNITY): Payer: Self-pay | Admitting: Emergency Medicine

## 2020-07-15 ENCOUNTER — Other Ambulatory Visit: Payer: Self-pay

## 2020-07-15 ENCOUNTER — Emergency Department (HOSPITAL_COMMUNITY)
Admission: EM | Admit: 2020-07-15 | Discharge: 2020-07-15 | Disposition: A | Discharge status: Home / Self Care | Payer: BC Managed Care – PPO | Attending: Emergency Medicine | Admitting: Emergency Medicine

## 2020-07-15 DIAGNOSIS — M546 Pain in thoracic spine: Secondary | ICD-10-CM | POA: Diagnosis not present

## 2020-07-15 DIAGNOSIS — S46811A Strain of other muscles, fascia and tendons at shoulder and upper arm level, right arm, initial encounter: Secondary | ICD-10-CM | POA: Insufficient documentation

## 2020-07-15 DIAGNOSIS — Y92410 Unspecified street and highway as the place of occurrence of the external cause: Secondary | ICD-10-CM | POA: Diagnosis not present

## 2020-07-15 DIAGNOSIS — M542 Cervicalgia: Secondary | ICD-10-CM | POA: Insufficient documentation

## 2020-07-15 DIAGNOSIS — M7918 Myalgia, other site: Secondary | ICD-10-CM

## 2020-07-15 DIAGNOSIS — S4991XA Unspecified injury of right shoulder and upper arm, initial encounter: Secondary | ICD-10-CM | POA: Diagnosis not present

## 2020-07-15 MED ORDER — CYCLOBENZAPRINE HCL 10 MG PO TABS: 10.0000 mg | ORAL_TABLET | Freq: Three times a day (TID) | ORAL | 0 refills | Status: AC
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
